# Patient Record
Sex: Female | Born: 1965 | Race: White | Hispanic: No | Marital: Single | Smoking: Former smoker
Health system: Southern US, Community
[De-identification: ages and names within clinical notes are randomized; demographics above are authoritative.]

## PROBLEM LIST (undated history)

## (undated) DIAGNOSIS — S72002A Fracture of unspecified part of neck of left femur, initial encounter for closed fracture: Secondary | ICD-10-CM

## (undated) DIAGNOSIS — E119 Type 2 diabetes mellitus without complications: Secondary | ICD-10-CM

## (undated) DIAGNOSIS — K7460 Unspecified cirrhosis of liver: Secondary | ICD-10-CM

## (undated) DIAGNOSIS — S72009A Fracture of unspecified part of neck of unspecified femur, initial encounter for closed fracture: Secondary | ICD-10-CM

## (undated) DIAGNOSIS — M1990 Unspecified osteoarthritis, unspecified site: Secondary | ICD-10-CM

## (undated) DIAGNOSIS — F32A Depression, unspecified: Secondary | ICD-10-CM

## (undated) DIAGNOSIS — R413 Other amnesia: Secondary | ICD-10-CM

## (undated) DIAGNOSIS — F329 Major depressive disorder, single episode, unspecified: Secondary | ICD-10-CM

## (undated) DIAGNOSIS — F419 Anxiety disorder, unspecified: Secondary | ICD-10-CM

## (undated) DIAGNOSIS — B1920 Unspecified viral hepatitis C without hepatic coma: Secondary | ICD-10-CM

## (undated) DIAGNOSIS — K759 Inflammatory liver disease, unspecified: Secondary | ICD-10-CM

## (undated) DIAGNOSIS — R4020 Unspecified coma: Secondary | ICD-10-CM

## (undated) DIAGNOSIS — Z8489 Family history of other specified conditions: Secondary | ICD-10-CM

## (undated) HISTORY — DX: Unspecified osteoarthritis, unspecified site: M19.90

## (undated) HISTORY — DX: Unspecified viral hepatitis C without hepatic coma: B19.20

## (undated) HISTORY — DX: Anxiety disorder, unspecified: F41.9

## (undated) HISTORY — PX: FRACTURE SURGERY: SHX138

---

## 1995-04-29 DIAGNOSIS — R4020 Unspecified coma: Secondary | ICD-10-CM

## 1995-04-29 HISTORY — PX: OTHER SURGICAL HISTORY: SHX169

## 1995-04-29 HISTORY — DX: Unspecified coma: R40.20

## 2009-09-24 DIAGNOSIS — M5106 Intervertebral disc disorders with myelopathy, lumbar region: Secondary | ICD-10-CM | POA: Insufficient documentation

## 2015-06-26 DIAGNOSIS — K7460 Unspecified cirrhosis of liver: Secondary | ICD-10-CM

## 2015-06-26 DIAGNOSIS — B1920 Unspecified viral hepatitis C without hepatic coma: Secondary | ICD-10-CM

## 2015-06-26 DIAGNOSIS — D696 Thrombocytopenia, unspecified: Secondary | ICD-10-CM

## 2015-06-26 DIAGNOSIS — D72819 Decreased white blood cell count, unspecified: Secondary | ICD-10-CM

## 2016-07-02 DIAGNOSIS — D72819 Decreased white blood cell count, unspecified: Secondary | ICD-10-CM | POA: Diagnosis not present

## 2016-07-02 DIAGNOSIS — K7460 Unspecified cirrhosis of liver: Secondary | ICD-10-CM | POA: Diagnosis not present

## 2016-07-02 DIAGNOSIS — D6959 Other secondary thrombocytopenia: Secondary | ICD-10-CM | POA: Diagnosis not present

## 2016-07-02 DIAGNOSIS — R161 Splenomegaly, not elsewhere classified: Secondary | ICD-10-CM | POA: Diagnosis not present

## 2016-10-26 DIAGNOSIS — E1165 Type 2 diabetes mellitus with hyperglycemia: Secondary | ICD-10-CM | POA: Diagnosis not present

## 2016-10-26 DIAGNOSIS — Z8669 Personal history of other diseases of the nervous system and sense organs: Secondary | ICD-10-CM | POA: Diagnosis not present

## 2016-10-26 DIAGNOSIS — E871 Hypo-osmolality and hyponatremia: Secondary | ICD-10-CM

## 2016-10-26 DIAGNOSIS — Z87820 Personal history of traumatic brain injury: Secondary | ICD-10-CM | POA: Diagnosis not present

## 2016-10-26 DIAGNOSIS — R531 Weakness: Secondary | ICD-10-CM | POA: Diagnosis not present

## 2016-10-27 DIAGNOSIS — E1165 Type 2 diabetes mellitus with hyperglycemia: Secondary | ICD-10-CM | POA: Diagnosis not present

## 2016-10-27 DIAGNOSIS — I6789 Other cerebrovascular disease: Secondary | ICD-10-CM | POA: Diagnosis not present

## 2016-10-27 DIAGNOSIS — R531 Weakness: Secondary | ICD-10-CM | POA: Diagnosis not present

## 2016-10-27 DIAGNOSIS — Z87820 Personal history of traumatic brain injury: Secondary | ICD-10-CM | POA: Diagnosis not present

## 2016-10-27 DIAGNOSIS — Z8669 Personal history of other diseases of the nervous system and sense organs: Secondary | ICD-10-CM | POA: Diagnosis not present

## 2016-12-11 ENCOUNTER — Encounter (HOSPITAL_COMMUNITY): Payer: Self-pay | Admitting: Emergency Medicine

## 2016-12-11 ENCOUNTER — Inpatient Hospital Stay (HOSPITAL_COMMUNITY)
Admission: EM | Admit: 2016-12-11 | Discharge: 2016-12-15 | DRG: 481 | Disposition: A | Discharge status: Skilled Nursing Facility | Payer: Medicaid Other | Attending: Family Medicine | Admitting: Family Medicine

## 2016-12-11 ENCOUNTER — Emergency Department (HOSPITAL_COMMUNITY): Payer: Medicaid Other

## 2016-12-11 DIAGNOSIS — R0602 Shortness of breath: Secondary | ICD-10-CM | POA: Diagnosis not present

## 2016-12-11 DIAGNOSIS — W010XXA Fall on same level from slipping, tripping and stumbling without subsequent striking against object, initial encounter: Secondary | ICD-10-CM | POA: Diagnosis present

## 2016-12-11 DIAGNOSIS — S72142A Displaced intertrochanteric fracture of left femur, initial encounter for closed fracture: Principal | ICD-10-CM | POA: Diagnosis present

## 2016-12-11 DIAGNOSIS — G629 Polyneuropathy, unspecified: Secondary | ICD-10-CM | POA: Diagnosis present

## 2016-12-11 DIAGNOSIS — G43909 Migraine, unspecified, not intractable, without status migrainosus: Secondary | ICD-10-CM | POA: Diagnosis present

## 2016-12-11 DIAGNOSIS — E119 Type 2 diabetes mellitus without complications: Secondary | ICD-10-CM

## 2016-12-11 DIAGNOSIS — E861 Hypovolemia: Secondary | ICD-10-CM | POA: Diagnosis present

## 2016-12-11 DIAGNOSIS — G43709 Chronic migraine without aura, not intractable, without status migrainosus: Secondary | ICD-10-CM | POA: Diagnosis present

## 2016-12-11 DIAGNOSIS — F1021 Alcohol dependence, in remission: Secondary | ICD-10-CM | POA: Diagnosis present

## 2016-12-11 DIAGNOSIS — E1165 Type 2 diabetes mellitus with hyperglycemia: Secondary | ICD-10-CM | POA: Diagnosis present

## 2016-12-11 DIAGNOSIS — F329 Major depressive disorder, single episode, unspecified: Secondary | ICD-10-CM | POA: Diagnosis present

## 2016-12-11 DIAGNOSIS — Z8619 Personal history of other infectious and parasitic diseases: Secondary | ICD-10-CM | POA: Diagnosis not present

## 2016-12-11 DIAGNOSIS — S72002A Fracture of unspecified part of neck of left femur, initial encounter for closed fracture: Secondary | ICD-10-CM | POA: Diagnosis not present

## 2016-12-11 DIAGNOSIS — D62 Acute posthemorrhagic anemia: Secondary | ICD-10-CM | POA: Diagnosis not present

## 2016-12-11 DIAGNOSIS — Z87820 Personal history of traumatic brain injury: Secondary | ICD-10-CM | POA: Diagnosis not present

## 2016-12-11 DIAGNOSIS — Z79899 Other long term (current) drug therapy: Secondary | ICD-10-CM | POA: Diagnosis not present

## 2016-12-11 DIAGNOSIS — B182 Chronic viral hepatitis C: Secondary | ICD-10-CM | POA: Diagnosis present

## 2016-12-11 DIAGNOSIS — K7469 Other cirrhosis of liver: Secondary | ICD-10-CM | POA: Diagnosis present

## 2016-12-11 DIAGNOSIS — D696 Thrombocytopenia, unspecified: Secondary | ICD-10-CM | POA: Diagnosis present

## 2016-12-11 DIAGNOSIS — Z419 Encounter for procedure for purposes other than remedying health state, unspecified: Secondary | ICD-10-CM

## 2016-12-11 DIAGNOSIS — E871 Hypo-osmolality and hyponatremia: Secondary | ICD-10-CM | POA: Diagnosis present

## 2016-12-11 DIAGNOSIS — E8889 Other specified metabolic disorders: Secondary | ICD-10-CM | POA: Diagnosis present

## 2016-12-11 DIAGNOSIS — K7460 Unspecified cirrhosis of liver: Secondary | ICD-10-CM | POA: Diagnosis present

## 2016-12-11 DIAGNOSIS — T148XXA Other injury of unspecified body region, initial encounter: Secondary | ICD-10-CM

## 2016-12-11 DIAGNOSIS — F17210 Nicotine dependence, cigarettes, uncomplicated: Secondary | ICD-10-CM | POA: Diagnosis present

## 2016-12-11 DIAGNOSIS — Z09 Encounter for follow-up examination after completed treatment for conditions other than malignant neoplasm: Secondary | ICD-10-CM

## 2016-12-11 DIAGNOSIS — Z794 Long term (current) use of insulin: Secondary | ICD-10-CM | POA: Diagnosis not present

## 2016-12-11 DIAGNOSIS — M21372 Foot drop, left foot: Secondary | ICD-10-CM | POA: Diagnosis present

## 2016-12-11 DIAGNOSIS — IMO0002 Reserved for concepts with insufficient information to code with codable children: Secondary | ICD-10-CM | POA: Diagnosis present

## 2016-12-11 HISTORY — DX: Fracture of unspecified part of neck of left femur, initial encounter for closed fracture: S72.002A

## 2016-12-11 HISTORY — DX: Major depressive disorder, single episode, unspecified: F32.9

## 2016-12-11 HISTORY — DX: Depression, unspecified: F32.A

## 2016-12-11 HISTORY — DX: Fracture of unspecified part of neck of unspecified femur, initial encounter for closed fracture: S72.009A

## 2016-12-11 HISTORY — DX: Type 2 diabetes mellitus without complications: E11.9

## 2016-12-11 HISTORY — DX: Unspecified cirrhosis of liver: K74.60

## 2016-12-11 HISTORY — DX: Inflammatory liver disease, unspecified: K75.9

## 2016-12-11 HISTORY — DX: Family history of other specified conditions: Z84.89

## 2016-12-11 HISTORY — DX: Unspecified coma: R40.20

## 2016-12-11 HISTORY — DX: Other amnesia: R41.3

## 2016-12-11 LAB — CBC WITH DIFFERENTIAL/PLATELET
BASOS PCT: 0 %
Basophils Absolute: 0 10*3/uL (ref 0.0–0.1)
EOS ABS: 0.1 10*3/uL (ref 0.0–0.7)
Eosinophils Relative: 1 %
HEMATOCRIT: 36.8 % (ref 36.0–46.0)
Hemoglobin: 13.5 g/dL (ref 12.0–15.0)
Lymphocytes Relative: 20 %
Lymphs Abs: 0.8 10*3/uL (ref 0.7–4.0)
MCH: 34 pg (ref 26.0–34.0)
MCHC: 36.7 g/dL — AB (ref 30.0–36.0)
MCV: 92.7 fL (ref 78.0–100.0)
MONO ABS: 0.2 10*3/uL (ref 0.1–1.0)
MONOS PCT: 6 %
NEUTROS ABS: 3.1 10*3/uL (ref 1.7–7.7)
Neutrophils Relative %: 73 %
Platelets: 51 10*3/uL — ABNORMAL LOW (ref 150–400)
RBC: 3.97 MIL/uL (ref 3.87–5.11)
RDW: 13.9 % (ref 11.5–15.5)
WBC: 4.2 10*3/uL (ref 4.0–10.5)

## 2016-12-11 LAB — BASIC METABOLIC PANEL
Anion gap: 8 (ref 5–15)
BUN: 6 mg/dL (ref 6–20)
CALCIUM: 8.9 mg/dL (ref 8.9–10.3)
CO2: 22 mmol/L (ref 22–32)
CREATININE: 0.51 mg/dL (ref 0.44–1.00)
Chloride: 98 mmol/L — ABNORMAL LOW (ref 101–111)
GFR calc non Af Amer: >60 mL/min (ref >60)
Glucose, Bld: 405 mg/dL — ABNORMAL HIGH (ref 65–99)
Potassium: 3.7 mmol/L (ref 3.5–5.1)
Sodium: 128 mmol/L — ABNORMAL LOW (ref 135–145)

## 2016-12-11 LAB — TYPE AND SCREEN
ABO/RH(D): O POS
Antibody Screen: NEGATIVE

## 2016-12-11 MED ORDER — INSULIN DETEMIR 100 UNIT/ML ~~LOC~~ SOLN
15.0000 [IU] | Freq: Every day | SUBCUTANEOUS | Status: DC
  Filled 2016-12-11 (×2): qty 0.15

## 2016-12-11 MED ORDER — SENNOSIDES-DOCUSATE SODIUM 8.6-50 MG PO TABS: 1.0000 | ORAL_TABLET | Freq: Every evening | ORAL | Status: DC | PRN

## 2016-12-11 MED ORDER — MORPHINE SULFATE (PF) 4 MG/ML IV SOLN
4.0000 mg | Freq: Once | INTRAVENOUS | Status: AC
  Administered 2016-12-11: 4 mg via INTRAVENOUS
  Filled 2016-12-11: qty 1

## 2016-12-11 MED ORDER — VITAMIN C 500 MG PO TABS
1000.0000 mg | ORAL_TABLET | Freq: Every day | ORAL | Status: DC
  Administered 2016-12-14 – 2016-12-15 (×2): 1000 mg via ORAL
  Filled 2016-12-11 (×3): qty 2

## 2016-12-11 MED ORDER — SODIUM CHLORIDE 0.9 % IV SOLN
INTRAVENOUS | Status: AC
  Administered 2016-12-12: via INTRAVENOUS

## 2016-12-11 MED ORDER — ONDANSETRON HCL 4 MG/2ML IJ SOLN: 4.0000 mg | Freq: Four times a day (QID) | INTRAMUSCULAR | Status: DC | PRN

## 2016-12-11 MED ORDER — GABAPENTIN 100 MG PO CAPS
100.0000 mg | ORAL_CAPSULE | Freq: Every day | ORAL | Status: DC
  Administered 2016-12-14 – 2016-12-15 (×2): 100 mg via ORAL
  Filled 2016-12-11 (×2): qty 1

## 2016-12-11 MED ORDER — INSULIN ASPART 100 UNIT/ML ~~LOC~~ SOLN
0.0000 [IU] | SUBCUTANEOUS | Status: DC
  Administered 2016-12-12: 2 [IU] via SUBCUTANEOUS
  Administered 2016-12-12 (×2): 8 [IU] via SUBCUTANEOUS
  Administered 2016-12-12: 2 [IU] via SUBCUTANEOUS
  Administered 2016-12-13 (×3): 3 [IU] via SUBCUTANEOUS
  Administered 2016-12-13: 8 [IU] via SUBCUTANEOUS
  Administered 2016-12-14 (×2): 5 [IU] via SUBCUTANEOUS
  Administered 2016-12-14: 11 [IU] via SUBCUTANEOUS
  Administered 2016-12-14 (×3): 5 [IU] via SUBCUTANEOUS
  Administered 2016-12-15: 8 [IU] via SUBCUTANEOUS
  Administered 2016-12-15: 5 [IU] via SUBCUTANEOUS
  Administered 2016-12-15: 3 [IU] via SUBCUTANEOUS

## 2016-12-11 MED ORDER — BISACODYL 5 MG PO TBEC
5.0000 mg | DELAYED_RELEASE_TABLET | Freq: Every day | ORAL | Status: DC | PRN
  Administered 2016-12-15: 5 mg via ORAL
  Filled 2016-12-11: qty 1

## 2016-12-11 MED ORDER — AMITRIPTYLINE HCL 50 MG PO TABS
100.0000 mg | ORAL_TABLET | Freq: Every day | ORAL | Status: DC
  Administered 2016-12-12 – 2016-12-14 (×4): 100 mg via ORAL
  Filled 2016-12-11 (×2): qty 2
  Filled 2016-12-11: qty 4

## 2016-12-11 MED ORDER — MORPHINE SULFATE (PF) 4 MG/ML IV SOLN
0.5000 mg | INTRAVENOUS | Status: DC | PRN
  Administered 2016-12-12 – 2016-12-14 (×10): 0.52 mg via INTRAVENOUS
  Filled 2016-12-11 (×12): qty 1

## 2016-12-11 MED ORDER — HYDROCODONE-ACETAMINOPHEN 5-325 MG PO TABS
1.0000 | ORAL_TABLET | Freq: Four times a day (QID) | ORAL | Status: DC | PRN
  Administered 2016-12-12 – 2016-12-15 (×8): 2 via ORAL
  Filled 2016-12-11 (×10): qty 2

## 2016-12-11 MED ORDER — SODIUM CHLORIDE 0.9 % IV SOLN
INTRAVENOUS | Status: DC
  Administered 2016-12-11: 20 mL/h via INTRAVENOUS

## 2016-12-11 MED ORDER — SODIUM CHLORIDE 0.9 % IV BOLUS (SEPSIS): 500.0000 mL | Freq: Once | INTRAVENOUS | Status: DC

## 2016-12-11 NOTE — ED Notes (Signed)
Patient transported to X-ray 
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
## 2016-12-11 NOTE — ED Provider Notes (Signed)
[facility] DEPT Provider Note   CSN: [id number] Arrival date & time: 12/11/16  1932     History   Chief Complaint No chief complaint on file.   HPI Alexis Floyd is a 51 y.o. female.  51 year old female here complaining of left hip pain that started after she had a mechanical fall just prior to arrival. States that she lost her balance. Denies any head or neck trauma. No chest or abdominal discomfort. Has dull pain at her left lateral hip that is worse with any movement. Denies any back pain. No saddle anesthesias. No numbness to her left foot. EMS called and patient treated with fentanyl as well as immobilization and transported here. She does have a prior history of hip surgery.      No past medical history on file.  There are no active problems to display for this patient.   No past surgical history on file.  OB History    No data available       Home Medications    Prior to Admission medications   Not on File    Family History No family history on file.  Social History Social History  Substance Use Topics  . Smoking status: Not on file  . Smokeless tobacco: Not on file  . Alcohol use Not on file     Allergies   Patient has no allergy information on record.   Review of Systems Review of Systems  All other systems reviewed and are negative.    Physical Exam Updated Vital Signs There were no vitals taken for this visit.  Physical Exam  Constitutional: She is oriented to person, place, and time. She appears well-developed and well-nourished.  Non-toxic appearance. No distress.  HENT:  Head: Normocephalic and atraumatic.  Eyes: Pupils are equal, round, and reactive to light. Conjunctivae, EOM and lids are normal.  Neck: Normal range of motion. Neck supple. No tracheal deviation present. No thyroid mass present.  Cardiovascular: Normal rate, regular rhythm and normal heart sounds.  Exam reveals no gallop.   No murmur  heard. Pulmonary/Chest: Effort normal and breath sounds normal. No stridor. No respiratory distress. She has no decreased breath sounds. She has no wheezes. She has no rhonchi. She has no rales.  Abdominal: Soft. Normal appearance and bowel sounds are normal. She exhibits no distension. There is no tenderness. There is no rebound and no CVA tenderness.  Musculoskeletal: She exhibits no edema.       Left hip: She exhibits decreased range of motion, tenderness and bony tenderness.       Legs: Left hip is shortened and slightly internally rotated. Patellar reflex intact. Due to palpation at the left greater trochanter. Paraspinal muscle tenderness of the lower lumbar spine.  Neurological: She is alert and oriented to person, place, and time. She has normal strength. No cranial nerve deficit or sensory deficit. GCS eye subscore is 4. GCS verbal subscore is 5. GCS motor subscore is 6.  Skin: Skin is warm and dry. No abrasion and no rash noted.  Psychiatric: She has a normal mood and affect. Her speech is normal and behavior is normal.  Nursing note and vitals reviewed.    ED Treatments / Results  Labs (all labs ordered are listed, but only abnormal results are displayed) Labs Reviewed - No data to display  EKG  EKG Interpretation None       Radiology No results found.  Procedures Procedures (including critical care time)  Medications Ordered in ED Medications -  No data to display   Initial Impression / Assessment and Plan / ED Course  I have reviewed the triage vital signs and the nursing notes.  Pertinent labs & imaging results that were available during my care of the patient were reviewed by me and considered in my medical decision making (see chart for details).     Patient medicated for pain here. X-ray consistent with left hip fracture. Discussed with orthopedics on-call will see her tomorrow morning. Patient to be admitted to the hospitalist service  Final Clinical  Impressions(s) / ED Diagnoses   Final diagnoses:  None    New Prescriptions New Prescriptions   No medications on file     Lorre Nick, MD 12/11/16 2200

## 2016-12-11 NOTE — H&P (Signed)
History and Physical    Alexis Floyd ZOX:[id number] DOB: 1966-04-26 DOA: 12/11/2016  PCP: No primary care provider on file.   Patient coming from: Home  Chief Complaint: Fall with severe left hip pain  HPI: Alexis Floyd is a 51 y.o. female with medical history significant for alcoholism in remission, insulin-dependent diabetes mellitus, chronic migraines, liver cirrhosis, and chronic thrombocytopenia, now presenting to the emergency department with severe left hip pain after a mechanical fall. Patient reports that she was in her usual state of health when she tripped over something and fell onto her left side, experiencing severe pain at the left hip. She denies striking her head or losing consciousness. EMS was called out to the scene and the patient was treated with 250 g of fentanyl and 500 mL of normal saline. She reports being in her usual state to the fall. No chest pain or palpitations. No recent fevers or chills.   ED Course: Upon arrival to the ED, patient is found to be afebrile, saturating well on room air, and with vitals otherwise stable. Chest x-ray is negative for acute cardiopulmonary disease. Chemistry panels notable for a serum glucose of 495 and sodium of 128. CBC features a chronic thrombocytopenia with platelets 51,000. EKG and urinalysis remained pending at this time. Radiographs of the hips and pelvis demonstrated impacted angulated probable subtrochanteric fracture of the left hip. Orthopedic surgery was consulted by the ED physician, request for medical admission, and indicated that they will tentatively plan to operate in the morning. Patient remained hemodynamically stable and in no apparent respiratory distress, and she will be admitted to the medical/surgical unit for ongoing evaluation and management of left hip fracture.  Review of Systems:  All other systems reviewed and apart from HPI, are negative.  Past Medical History:  Diagnosis Date  . Closed left hip  fracture, initial encounter (HCC) 12/11/2016  . Diabetes mellitus without complication (HCC)    type II  . Hip fx (HCC)    Bilateral    Past Surgical History:  Procedure Laterality Date  . FRACTURE SURGERY       reports that she has been smoking Cigarettes.  She has been smoking about 1.00 pack per day. She has never used smokeless tobacco. She reports that she uses drugs, including Marijuana. She reports that she does not drink alcohol.  No Known Allergies  History reviewed. No pertinent family history.   Prior to Admission medications   Medication Sig Start Date End Date Taking? Authorizing Provider  amitriptyline (ELAVIL) 100 MG tablet Take 100 mg by mouth. 08/26/16 08/26/17 Yes [provider]  Ascorbic Acid (VITAMIN C) 1000 MG tablet Take 1,000 mg by mouth.   Yes [provider]  gabapentin (NEURONTIN) 100 MG capsule Take 100 mg by mouth daily.   Yes [provider]  Insulin Detemir (LEVEMIR FLEXTOUCH) 100 UNIT/ML Pen Inject 20 Units into the skin at bedtime.   Yes [provider]    Physical Exam: Vitals:   12/11/16 1944 12/11/16 1945 12/11/16 1948 12/11/16 2045  BP: 127/90 (!) 138/96  (!) 141/74  Pulse: 96 99  (!) 101  Resp: 16 16  16   Temp: 98.3 F (36.8 C)     TempSrc: Oral     SpO2: 97% 96%  99%  Weight:   59.4 kg (131 lb)   Height:   5\' 6"  (1.676 m)       Constitutional: NAD, calm, in apparent discomfort.  Eyes: PERTLA, lids and conjunctivae normal ENMT:  Mucous membranes are dry. Posterior pharynx clear of any exudate or lesions.   Neck: normal, supple, no masses, no thyromegaly Respiratory: clear to auscultation bilaterally, no wheezing, no crackles. Normal respiratory effort.  Cardiovascular: S1 & S2 heard, regular rate and rhythm. No extremity edema.   Abdomen: No distension, no tenderness, no masses palpated. Bowel sounds active.  Musculoskeletal: no clubbing / cyanosis. Left hip exquisitely tender, with 2+ pedal pulse  and intact sensation and motor function in left foot.  Skin: no significant rashes, lesions, ulcers. Warm, dry, well-perfused. Neurologic: Dysarthria (chronic per neurology office notes). Sensation intact. Strength 5/5 in all 4 limbs.  Psychiatric: Alert and oriented x 3. Calm, cooperative.    Labs on Admission: I have personally reviewed following labs and imaging studies  CBC:  Recent Labs Lab 12/11/16 2102  WBC 4.2  NEUTROABS 3.1  HGB 13.5  HCT 36.8  MCV 92.7  PLT 51*   Basic Metabolic Panel:  Recent Labs Lab 12/11/16 2102  NA 128*  K 3.7  CL 98*  CO2 22  GLUCOSE 405*  BUN 6  CREATININE 0.51  CALCIUM 8.9   GFR: Estimated Creatinine Clearance: 79.6 mL/min (by C-G formula based on SCr of 0.51 mg/dL). Liver Function Tests: No results for input(s): AST, ALT, ALKPHOS, BILITOT, PROT, ALBUMIN in the last 168 hours. No results for input(s): LIPASE, AMYLASE in the last 168 hours. No results for input(s): AMMONIA in the last 168 hours. Coagulation Profile: No results for input(s): INR, PROTIME in the last 168 hours. Cardiac Enzymes: No results for input(s): CKTOTAL, CKMB, CKMBINDEX, TROPONINI in the last 168 hours. BNP (last 3 results) No results for input(s): PROBNP in the last 8760 hours. HbA1C: No results for input(s): HGBA1C in the last 72 hours. CBG: No results for input(s): GLUCAP in the last 168 hours. Lipid Profile: No results for input(s): CHOL, HDL, LDLCALC, TRIG, CHOLHDL, LDLDIRECT in the last 72 hours. Thyroid Function Tests: No results for input(s): TSH, T4TOTAL, FREET4, T3FREE, THYROIDAB in the last 72 hours. Anemia Panel: No results for input(s): VITAMINB12, FOLATE, FERRITIN, TIBC, IRON, RETICCTPCT in the last 72 hours. Urine analysis: No results found for: COLORURINE, APPEARANCEUR, LABSPEC, PHURINE, GLUCOSEU, HGBUR, BILIRUBINUR, KETONESUR, PROTEINUR, UROBILINOGEN, NITRITE, LEUKOCYTESUR Sepsis Labs: @LABRCNTIP (procalcitonin:4,lacticidven:4) )No  results found for this or any previous visit (from the past 240 hour(s)).   Radiological Exams on Admission: Dg Chest 1 View  Result Date: 12/11/2016 CLINICAL DATA:  Preoperative exam EXAM: CHEST 1 VIEW COMPARISON:  None. FINDINGS: The heart size and mediastinal contours are within normal limits. Both lungs are clear. The visualized skeletal structures are unremarkable. IMPRESSION: No active disease. Electronically Signed   By: Deatra RobinsonKevin  Herman M.D.   On: 12/11/2016 22:01   Dg Hip Unilat W Or Wo Pelvis 2-3 Views Left  Result Date: 12/11/2016 CLINICAL DATA:  51 year old female with history of trauma from a fall complaining of pain in the left hip and left leg. EXAM: DG HIP (WITH OR WITHOUT PELVIS) 2-3V LEFT COMPARISON:  No priors. FINDINGS: Two nonstandard views are submitted for evaluation, which demonstrates what appears to be an impacted subtrochanteric fracture of the left hip with some varus angulation. There is also approximately 9 mm of medial displacement. Left femoral head appears to be located on this limited examination. Visualized portions of the bony pelvis and right proximal femur also appear intact. IMPRESSION: 1. Impacted angulated probable subtrochanteric fracture of the left hip, as discussed above. The possibility of an intertrochanteric component of the fracture is not  excluded. Electronically Signed   By: Trudie Reed M.D.   On: 12/11/2016 20:41    EKG: Independently reviewed. Normal sinus rhythm.   Assessment/Plan  1. Left hip fracture  - Pt presents with severe left hip pain after a ground-level mechanical fall, and radiographs demonstrate likely subtrochanteric fracture  - Orthopedic surgery is consulting and much appreciated, planning for operative repair on 12/12/16  - Based on the available data, Alexis Floyd presents an estimated 0.4% risk probability for perioperative MI or cardiac arrest per Nolon Nations al - Platelets chronically low, 51k on admission, will ask blood  bank to prepare platelets for transfusion in am if platelets 50k or less  - Continue IVF hydration, NPO, prn analgesia    2. Insulin-dependent DM with hyperglycemia  - Serum glucose is 405 on admission; no A1c on file  - Managed at home with Levemir 20 units qHS  - Check CBG q4h while NPO  - Start with 15 units Levemir and moderate-intensity SSI with Novolog   3. Liver cirrhosis  - Previously followed by GI at [facility]  - Attributed to HCV and prior alcohol abuse  - No ascites on exam  - Appears well-compensated   4. Hyponatremia  - Serum sodium is 128 on admission  - Likely secondary to marked hyperglycemia, cirrhosis, and hypovolemia  - Plan to treat with insulin and normal saline, repeat chemistries in am    5. Chronic migraine  - Stable  - Continue Elavil   6. Thrombocytopenia  - Platelets 51,000 on admission  - Chronically low and likely secondary to liver disease  - There is no bleeding evident  - Repeat CBC in am and transfuse platelets if 50k or less     DVT prophylaxis: SCD's  Code Status: Full  Family Communication: Discussed with patient Disposition Plan: Admit to med-surg Consults called: Orthopedic surgery Admission status: Inpatient    Briscoe Deutscher, MD [facility] Pager [phone]  If 7PM-7AM, please contact night-coverage [email or web address] Password [email or web address]  12/11/2016, 10:13 PM

## 2016-12-12 ENCOUNTER — Encounter (HOSPITAL_COMMUNITY): Payer: Self-pay | Admitting: General Practice

## 2016-12-12 ENCOUNTER — Inpatient Hospital Stay (HOSPITAL_COMMUNITY): Payer: Medicaid Other

## 2016-12-12 DIAGNOSIS — S72002A Fracture of unspecified part of neck of left femur, initial encounter for closed fracture: Secondary | ICD-10-CM

## 2016-12-12 LAB — GLUCOSE, CAPILLARY
GLUCOSE-CAPILLARY: 280 mg/dL — AB (ref 65–99)
GLUCOSE-CAPILLARY: 263 mg/dL — AB (ref 65–99)
GLUCOSE-CAPILLARY: 194 mg/dL — AB (ref 65–99)
GLUCOSE-CAPILLARY: 139 mg/dL — AB (ref 65–99)
Glucose-Capillary: 304 mg/dL — ABNORMAL HIGH (ref 65–99)

## 2016-12-12 LAB — BASIC METABOLIC PANEL
Anion gap: 10 (ref 5–15)
BUN: 5 mg/dL — AB (ref 6–20)
CO2: 23 mmol/L (ref 22–32)
CREATININE: 0.43 mg/dL — AB (ref 0.44–1.00)
Calcium: 8.9 mg/dL (ref 8.9–10.3)
Chloride: 101 mmol/L (ref 101–111)
GFR calc Af Amer: >60 mL/min (ref >60)
Glucose, Bld: 314 mg/dL — ABNORMAL HIGH (ref 65–99)
Potassium: 3.7 mmol/L (ref 3.5–5.1)
SODIUM: 134 mmol/L — AB (ref 135–145)

## 2016-12-12 LAB — URINALYSIS, ROUTINE W REFLEX MICROSCOPIC
BILIRUBIN URINE: NEGATIVE
HGB URINE DIPSTICK: NEGATIVE
KETONES UR: 20 mg/dL — AB
Leukocytes, UA: NEGATIVE
NITRITE: NEGATIVE
PROTEIN: NEGATIVE mg/dL
Specific Gravity, Urine: 1.022 (ref 1.005–1.030)
pH: 5 (ref 5.0–8.0)

## 2016-12-12 LAB — CBC
HCT: 36.8 % (ref 36.0–46.0)
Hemoglobin: 13.4 g/dL (ref 12.0–15.0)
MCH: 33.2 pg (ref 26.0–34.0)
MCHC: 36.4 g/dL — AB (ref 30.0–36.0)
MCV: 91.1 fL (ref 78.0–100.0)
PLATELETS: 61 10*3/uL — AB (ref 150–400)
RBC: 4.04 MIL/uL (ref 3.87–5.11)
RDW: 13.7 % (ref 11.5–15.5)
WBC: 5.6 10*3/uL (ref 4.0–10.5)

## 2016-12-12 LAB — HEPATIC FUNCTION PANEL
ALBUMIN: 3.3 g/dL — AB (ref 3.5–5.0)
ALT: 36 U/L (ref 14–54)
AST: 41 U/L (ref 15–41)
Alkaline Phosphatase: 171 U/L — ABNORMAL HIGH (ref 38–126)
BILIRUBIN TOTAL: 1.7 mg/dL — AB (ref 0.3–1.2)
Bilirubin, Direct: 0.4 mg/dL (ref 0.1–0.5)
Indirect Bilirubin: 1.3 mg/dL — ABNORMAL HIGH (ref 0.3–0.9)
TOTAL PROTEIN: 7.3 g/dL (ref 6.5–8.1)

## 2016-12-12 LAB — HIV ANTIBODY (ROUTINE TESTING W REFLEX): HIV SCREEN 4TH GENERATION: NONREACTIVE

## 2016-12-12 LAB — RAPID URINE DRUG SCREEN, HOSP PERFORMED
Amphetamines: NOT DETECTED
Barbiturates: NOT DETECTED
Benzodiazepines: NOT DETECTED
Cocaine: NOT DETECTED
OPIATES: POSITIVE — AB
TETRAHYDROCANNABINOL: POSITIVE — AB

## 2016-12-12 LAB — HEMOGLOBIN A1C
HEMOGLOBIN A1C: 12.7 % — AB (ref 4.8–5.6)
MEAN PLASMA GLUCOSE: 317.79 mg/dL

## 2016-12-12 LAB — PROTIME-INR
INR: 1.06
Prothrombin Time: 13.8 seconds (ref 11.4–15.2)

## 2016-12-12 LAB — ABO/RH: ABO/RH(D): O POS

## 2016-12-12 LAB — APTT: aPTT: 28 seconds (ref 24–36)

## 2016-12-12 MED ORDER — INSULIN DETEMIR 100 UNIT/ML ~~LOC~~ SOLN
20.0000 [IU] | Freq: Every day | SUBCUTANEOUS | Status: DC
  Administered 2016-12-12 – 2016-12-14 (×3): 20 [IU] via SUBCUTANEOUS
  Filled 2016-12-12 (×4): qty 0.2

## 2016-12-12 MED ORDER — GLUCERNA SHAKE PO LIQD
237.0000 mL | Freq: Two times a day (BID) | ORAL | Status: DC
  Administered 2016-12-13 – 2016-12-15 (×5): 237 mL via ORAL
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
## 2016-12-12 NOTE — Progress Notes (Addendum)
PROGRESS NOTE    Alexis Floyd  JQZ:[id number] DOB: April 17, 1966 DOA: 12/11/2016 PCP: Abner Greenspan, MD  Brief Narrative:  51 y.o. female with medical history significant for alcoholism in remission, insulin-dependent diabetes mellitus, chronic migraines, liver cirrhosis, and chronic thrombocytopenia, now presenting to the emergency department with severe left hip pain after a mechanical fall. Patient reports that she was in her usual state of health when she tripped over something and fell onto her left side, experiencing severe pain at the left hip.   Assessment & Plan:   Principal Problem:   Closed left hip fracture, initial encounter [facility]) - Orthopedic surgery on board and managing. - Continue supportive therapy  Active Problems:   Other cirrhosis of liver (HCC) - Stable AST and ALT were within normal limits    Thrombocytopenia (HCC) - Most likely secondary to liver disease    Hyponatremia - Mild and improved from last check    Diabetes mellitus, type II, insulin dependent (HCC) -Once able to advance diet will plan on advancing the diabetic diet. Continue current insulin regimen. But will increase lantus dose by 5 units    Chronic migraine - stable no HA reported.  DVT prophylaxis: Per orthopaedic surgery Code Status: Full Family Communication: none at bedside Disposition Plan: Pending improvement in condition based on surgery and physical therapy evaluation   Consultants:   ortho   Procedures: per ortho   Antimicrobials: None   Subjective: Patient has no new complaints reported to me today. Has been having pain but controlled with pain medication  Objective: Vitals:   12/11/16 2300 12/11/16 2315 12/12/16 0044 12/12/16 0434  BP: 125/89 112/88 138/62 135/82  Pulse: 100 99 (!) 102 100  Resp: (!) 25 17 19 17   Temp:   97.6 F (36.4 C) 97.8 F (36.6 C)  TempSrc:   Oral Oral  SpO2: 95% 93% 95% 93%  Weight:      Height:        Intake/Output Summary  (Last 24 hours) at 12/12/16 1713 Last data filed at 12/12/16 0928  Gross per 24 hour  Intake              240 ml  Output             1941 ml  Net            -1701 ml   Filed Weights   12/11/16 1948  Weight: 59.4 kg (131 lb)    Examination:  General exam: Appears calm and comfortable, in nad. Respiratory system: Clear to auscultation. Respiratory effort normal. Cardiovascular system: S1 & S2 heard, RRR. No JVD, murmurs, rubs, gallops  Gastrointestinal system: Abdomen is nondistended, soft and nontender. No organomegaly or masses felt. Normal bowel sounds heard. Central nervous system: Alert and oriented. No focal neurological deficits. Extremities: Symmetric 5 x 5 power. Skin: No rashes, lesions or ulcers, on limited exam. Psychiatry:  Mood & affect appropriate  Data Reviewed: I have personally reviewed following labs and imaging studies  CBC:  Recent Labs Lab 12/11/16 2102 12/12/16 0036  WBC 4.2 5.6  NEUTROABS 3.1  --   HGB 13.5 13.4  HCT 36.8 36.8  MCV 92.7 91.1  PLT 51* 61*   Basic Metabolic Panel:  Recent Labs Lab 12/11/16 2102 12/12/16 0036  NA 128* 134*  K 3.7 3.7  CL 98* 101  CO2 22 23  GLUCOSE 405* 314*  BUN 6 5*  CREATININE 0.51 0.43*  CALCIUM 8.9 8.9   GFR: Estimated Creatinine Clearance: 79.6  mL/min (A) (by C-G formula based on SCr of 0.43 mg/dL (L)). Liver Function Tests:  Recent Labs Lab 12/12/16 0036  AST 41  ALT 36  ALKPHOS 171*  BILITOT 1.7*  PROT 7.3  ALBUMIN 3.3*   No results for input(s): LIPASE, AMYLASE in the last 168 hours. No results for input(s): AMMONIA in the last 168 hours. Coagulation Profile:  Recent Labs Lab 12/12/16 0036  INR 1.06   Cardiac Enzymes: No results for input(s): CKTOTAL, CKMB, CKMBINDEX, TROPONINI in the last 168 hours. BNP (last 3 results) No results for input(s): PROBNP in the last 8760 hours. HbA1C:  Recent Labs  12/12/16 0036  HGBA1C 12.7*   CBG:  Recent Labs Lab 12/12/16 0006  12/12/16 0430 12/12/16 1011 12/12/16 1206 12/12/16 1647  GLUCAP 304* 280* 263* 194* 139*   Lipid Profile: No results for input(s): CHOL, HDL, LDLCALC, TRIG, CHOLHDL, LDLDIRECT in the last 72 hours. Thyroid Function Tests: No results for input(s): TSH, T4TOTAL, FREET4, T3FREE, THYROIDAB in the last 72 hours. Anemia Panel: No results for input(s): VITAMINB12, FOLATE, FERRITIN, TIBC, IRON, RETICCTPCT in the last 72 hours. Sepsis Labs: No results for input(s): PROCALCITON, LATICACIDVEN in the last 168 hours.  No results found for this or any previous visit (from the past 240 hour(s)).   Radiology Studies: Dg Chest 1 View  Result Date: 12/11/2016 CLINICAL DATA:  Preoperative exam EXAM: CHEST 1 VIEW COMPARISON:  None. FINDINGS: The heart size and mediastinal contours are within normal limits. Both lungs are clear. The visualized skeletal structures are unremarkable. IMPRESSION: No active disease. Electronically Signed   By: Deatra Robinson M.D.   On: 12/11/2016 22:01   Dg Hip Unilat With Pelvis 1v Left  Result Date: 12/12/2016 CLINICAL DATA:  Left femoral fracture EXAM: DG HIP (WITH OR WITHOUT PELVIS) 1V*L* COMPARISON:  None. FINDINGS: Comminuted left intertrochanteric fracture with mild angulation and displacement. No hip dislocation. No aggressive osseous lesion. IMPRESSION: Comminuted left intertrochanteric fracture with mild angulation and displacement. Electronically Signed   By: Elige Ko   On: 12/12/2016 15:04   Dg Hip Unilat W Or Wo Pelvis 2-3 Views Left  Result Date: 12/11/2016 CLINICAL DATA:  51 year old female with history of trauma from a fall complaining of pain in the left hip and left leg. EXAM: DG HIP (WITH OR WITHOUT PELVIS) 2-3V LEFT COMPARISON:  No priors. FINDINGS: Two nonstandard views are submitted for evaluation, which demonstrates what appears to be an impacted subtrochanteric fracture of the left hip with some varus angulation. There is also approximately 9 mm of  medial displacement. Left femoral head appears to be located on this limited examination. Visualized portions of the bony pelvis and right proximal femur also appear intact. IMPRESSION: 1. Impacted angulated probable subtrochanteric fracture of the left hip, as discussed above. The possibility of an intertrochanteric component of the fracture is not excluded. Electronically Signed   By: Trudie Reed M.D.   On: 12/11/2016 20:41   Scheduled Meds: . amitriptyline  100 mg Oral QHS  . [START ON 12/13/2016] feeding supplement (GLUCERNA SHAKE)  237 mL Oral BID BM  . gabapentin  100 mg Oral Daily  . insulin aspart  0-15 Units Subcutaneous Q4H  . insulin detemir  15 Units Subcutaneous QHS  . vitamin C  1,000 mg Oral Daily   Continuous Infusions: . sodium chloride       LOS: 1 day   Time spent:> 35  Penny Pia, MD [facility] Pager [phone]  If 7PM-7AM, please contact  night-coverage [email or web address] Password [email or web address] 12/12/2016, 5:13 PM

## 2016-12-12 NOTE — Progress Notes (Signed)
Orthopedic Tech Progress Note Patient Details:  Alexis Floyd 1965/06/13 [id number]  Musculoskeletal Traction Type of Traction: [place]ucks Skin Traction Traction Location: lle Traction Weight: 10 lbs    Trinna Post 12/12/2016, 8:43 PM
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
## 2016-12-12 NOTE — Progress Notes (Signed)
[facility]opaedic Trauma [facility]  Pt will go to OR tomorrow with Dr. Linna Caprice for IMN  L hip Appreciate his assistance with expediting pt care   Mearl Latin, PA-C [facility] [phone] (C) [phone] (O) 12/12/2016 10:12 PM

## 2016-12-13 ENCOUNTER — Inpatient Hospital Stay (HOSPITAL_COMMUNITY): Payer: Medicaid Other

## 2016-12-13 ENCOUNTER — Encounter (HOSPITAL_COMMUNITY)
Admission: EM | Disposition: A | Discharge status: Skilled Nursing Facility | Payer: Self-pay | Source: Home / Self Care | Attending: Family Medicine

## 2016-12-13 ENCOUNTER — Encounter (HOSPITAL_COMMUNITY): Payer: Self-pay | Admitting: Certified Registered Nurse Anesthetist

## 2016-12-13 ENCOUNTER — Inpatient Hospital Stay (HOSPITAL_COMMUNITY): Payer: Medicaid Other | Admitting: Certified Registered Nurse Anesthetist

## 2016-12-13 HISTORY — PX: INTRAMEDULLARY (IM) NAIL INTERTROCHANTERIC: SHX5875

## 2016-12-13 LAB — GLUCOSE, CAPILLARY
GLUCOSE-CAPILLARY: 132 mg/dL — AB (ref 65–99)
GLUCOSE-CAPILLARY: 165 mg/dL — AB (ref 65–99)
GLUCOSE-CAPILLARY: 180 mg/dL — AB (ref 65–99)
GLUCOSE-CAPILLARY: 198 mg/dL — AB (ref 65–99)
GLUCOSE-CAPILLARY: 204 mg/dL — AB (ref 65–99)
Glucose-Capillary: 133 mg/dL — ABNORMAL HIGH (ref 65–99)
Glucose-Capillary: 260 mg/dL — ABNORMAL HIGH (ref 65–99)

## 2016-12-13 LAB — VITAMIN D 25 HYDROXY (VIT D DEFICIENCY, FRACTURES): Vit D, 25-Hydroxy: 12.4 ng/mL — ABNORMAL LOW (ref 30.0–100.0)

## 2016-12-13 LAB — SURGICAL PCR SCREEN
MRSA, PCR: NEGATIVE
Staphylococcus aureus: POSITIVE — AB

## 2016-12-13 SURGERY — FIXATION, FRACTURE, INTERTROCHANTERIC, WITH INTRAMEDULLARY ROD
Anesthesia: General | Site: Leg Upper | Laterality: Left

## 2016-12-13 MED ORDER — SUGAMMADEX SODIUM 200 MG/2ML IV SOLN
INTRAVENOUS | Status: DC | PRN
  Administered 2016-12-13: 118.8 mg via INTRAVENOUS

## 2016-12-13 MED ORDER — MENTHOL 3 MG MT LOZG: 1.0000 | LOZENGE | OROMUCOSAL | Status: DC | PRN

## 2016-12-13 MED ORDER — MIDAZOLAM HCL 5 MG/5ML IJ SOLN
INTRAMUSCULAR | Status: DC | PRN
  Administered 2016-12-13: 2 mg via INTRAVENOUS

## 2016-12-13 MED ORDER — METOCLOPRAMIDE HCL 5 MG PO TABS: 5.0000 mg | ORAL_TABLET | Freq: Three times a day (TID) | ORAL | Status: DC | PRN

## 2016-12-13 MED ORDER — MEPERIDINE HCL 25 MG/ML IJ SOLN: 6.2500 mg | INTRAMUSCULAR | Status: DC | PRN

## 2016-12-13 MED ORDER — ROCURONIUM BROMIDE 100 MG/10ML IV SOLN
INTRAVENOUS | Status: DC | PRN
  Administered 2016-12-13: 40 mg via INTRAVENOUS

## 2016-12-13 MED ORDER — PROMETHAZINE HCL 25 MG/ML IJ SOLN: 6.2500 mg | INTRAMUSCULAR | Status: DC | PRN

## 2016-12-13 MED ORDER — PHENOL 1.4 % MT LIQD: 1.0000 | OROMUCOSAL | Status: DC | PRN

## 2016-12-13 MED ORDER — 0.9 % SODIUM CHLORIDE (POUR BTL) OPTIME
TOPICAL | Status: DC | PRN
  Administered 2016-12-13: 1000 mL

## 2016-12-13 MED ORDER — MUPIROCIN 2 % EX OINT
1.0000 "application " | TOPICAL_OINTMENT | Freq: Two times a day (BID) | CUTANEOUS | Status: DC
  Administered 2016-12-13 – 2016-12-15 (×5): 1 via NASAL
  Filled 2016-12-13: qty 22

## 2016-12-13 MED ORDER — FENTANYL CITRATE (PF) 100 MCG/2ML IJ SOLN
INTRAMUSCULAR | Status: DC | PRN
  Administered 2016-12-13 (×5): 50 ug via INTRAVENOUS

## 2016-12-13 MED ORDER — HYDROMORPHONE HCL 1 MG/ML IJ SOLN
INTRAMUSCULAR | Status: AC
  Filled 2016-12-13: qty 1

## 2016-12-13 MED ORDER — PHENYLEPHRINE HCL 10 MG/ML IJ SOLN
INTRAMUSCULAR | Status: DC | PRN
  Administered 2016-12-13 (×2): 80 ug via INTRAVENOUS
  Administered 2016-12-13: 40 ug via INTRAVENOUS

## 2016-12-13 MED ORDER — ENOXAPARIN SODIUM 40 MG/0.4ML ~~LOC~~ SOLN
40.0000 mg | SUBCUTANEOUS | Status: DC
  Administered 2016-12-14 – 2016-12-15 (×2): 40 mg via SUBCUTANEOUS
  Filled 2016-12-13 (×2): qty 0.4

## 2016-12-13 MED ORDER — METOCLOPRAMIDE HCL 5 MG/ML IJ SOLN: 5.0000 mg | Freq: Three times a day (TID) | INTRAMUSCULAR | Status: DC | PRN

## 2016-12-13 MED ORDER — HYDROMORPHONE HCL 1 MG/ML IJ SOLN
0.2500 mg | INTRAMUSCULAR | Status: DC | PRN
  Administered 2016-12-13 (×2): 0.5 mg via INTRAVENOUS

## 2016-12-13 MED ORDER — PROPOFOL 10 MG/ML IV BOLUS
INTRAVENOUS | Status: DC | PRN
  Administered 2016-12-13: 120 mg via INTRAVENOUS

## 2016-12-13 MED ORDER — MIDAZOLAM HCL 2 MG/2ML IJ SOLN
INTRAMUSCULAR | Status: AC
  Filled 2016-12-13: qty 2

## 2016-12-13 MED ORDER — FENTANYL CITRATE (PF) 250 MCG/5ML IJ SOLN
INTRAMUSCULAR | Status: AC
  Filled 2016-12-13: qty 5

## 2016-12-13 MED ORDER — ACETAMINOPHEN 650 MG RE SUPP: 650.0000 mg | Freq: Four times a day (QID) | RECTAL | Status: DC | PRN

## 2016-12-13 MED ORDER — PROPOFOL 10 MG/ML IV BOLUS
INTRAVENOUS | Status: AC
  Filled 2016-12-13: qty 20

## 2016-12-13 MED ORDER — CEFAZOLIN SODIUM-DEXTROSE 2-4 GM/100ML-% IV SOLN
2.0000 g | INTRAVENOUS | Status: DC
  Filled 2016-12-13: qty 100

## 2016-12-13 MED ORDER — CHLORHEXIDINE GLUCONATE CLOTH 2 % EX PADS
6.0000 | MEDICATED_PAD | Freq: Every day | CUTANEOUS | Status: DC
  Administered 2016-12-13: 6 via TOPICAL

## 2016-12-13 MED ORDER — CHLORHEXIDINE GLUCONATE 4 % EX LIQD: 60.0000 mL | Freq: Once | CUTANEOUS | Status: DC

## 2016-12-13 MED ORDER — LACTATED RINGERS IV SOLN: INTRAVENOUS | Status: DC

## 2016-12-13 MED ORDER — ESMOLOL HCL 100 MG/10ML IV SOLN
INTRAVENOUS | Status: DC | PRN
  Administered 2016-12-13 (×2): 20 mg via INTRAVENOUS

## 2016-12-13 MED ORDER — LACTATED RINGERS IV SOLN
INTRAVENOUS | Status: DC | PRN
  Administered 2016-12-13 (×2): via INTRAVENOUS

## 2016-12-13 MED ORDER — CEFAZOLIN SODIUM-DEXTROSE 2-3 GM-% IV SOLR
INTRAVENOUS | Status: DC | PRN
  Administered 2016-12-13: 2 g via INTRAVENOUS

## 2016-12-13 MED ORDER — PHENYLEPHRINE HCL 10 MG/ML IJ SOLN
INTRAVENOUS | Status: DC | PRN
  Administered 2016-12-13: 25 ug/min via INTRAVENOUS

## 2016-12-13 MED ORDER — LIDOCAINE HCL (CARDIAC) 20 MG/ML IV SOLN
INTRAVENOUS | Status: DC | PRN
  Administered 2016-12-13: 50 mg via INTRAVENOUS

## 2016-12-13 MED ORDER — ONDANSETRON HCL 4 MG/2ML IJ SOLN
INTRAMUSCULAR | Status: DC | PRN
Start: 2016-12-13 — End: 2016-12-13
  Administered 2016-12-13: 4 mg via INTRAVENOUS

## 2016-12-13 MED ORDER — POVIDONE-IODINE 10 % EX SWAB: 2.0000 "application " | Freq: Once | CUTANEOUS | Status: DC

## 2016-12-13 MED ORDER — ACETAMINOPHEN 325 MG PO TABS
650.0000 mg | ORAL_TABLET | Freq: Four times a day (QID) | ORAL | Status: DC | PRN
  Administered 2016-12-13 – 2016-12-15 (×4): 650 mg via ORAL
  Filled 2016-12-13 (×4): qty 2

## 2016-12-13 SURGICAL SUPPLY — 52 items
ADH SKN CLS LQ APL DERMABOND (GAUZE/BANDAGES/DRESSINGS) ×1
ALCOHOL ISOPROPYL (RUBBING) (MISCELLANEOUS) ×3 IMPLANT
BIT DRILL 4.3MMS DISTAL GRDTED (BIT) ×1 IMPLANT
BNDG COHESIVE 4X5 TAN STRL (GAUZE/BANDAGES/DRESSINGS) IMPLANT
CHLORAPREP W/TINT 26ML (MISCELLANEOUS) ×3 IMPLANT
COVER PERINEAL POST (MISCELLANEOUS) ×3 IMPLANT
COVER SURGICAL LIGHT HANDLE (MISCELLANEOUS) ×3 IMPLANT
DERMABOND ADHESIVE PROPEN (GAUZE/BANDAGES/DRESSINGS) ×2
DERMABOND ADVANCED (GAUZE/BANDAGES/DRESSINGS) ×4
DERMABOND ADVANCED .7 DNX12 (GAUZE/BANDAGES/DRESSINGS) ×2 IMPLANT
DERMABOND ADVANCED .7 DNX6 (GAUZE/BANDAGES/DRESSINGS) ×1 IMPLANT
DRAPE C-ARM 42X72 X-RAY (DRAPES) ×3 IMPLANT
DRAPE C-ARMOR (DRAPES) ×3 IMPLANT
DRAPE HALF SHEET 40X57 (DRAPES) ×3 IMPLANT
DRAPE IMP U-DRAPE 54X76 (DRAPES) ×6 IMPLANT
DRAPE STERI IOBAN 125X83 (DRAPES) ×3 IMPLANT
DRAPE U-SHAPE 47X51 STRL (DRAPES) ×6 IMPLANT
DRAPE UNIVERSAL PACK (DRAPES) ×3 IMPLANT
DRILL 4.3MMS DISTAL GRADUATED (BIT) ×3
DRSG MEPILEX BORDER 4X4 (GAUZE/BANDAGES/DRESSINGS) ×3 IMPLANT
DRSG MEPILEX BORDER 4X8 (GAUZE/BANDAGES/DRESSINGS) ×3 IMPLANT
DRSG PAD ABDOMINAL 8X10 ST (GAUZE/BANDAGES/DRESSINGS) IMPLANT
ELECT REM PT RETURN 9FT ADLT (ELECTROSURGICAL) ×3
ELECTRODE REM PT RTRN 9FT ADLT (ELECTROSURGICAL) ×1 IMPLANT
FACESHIELD WRAPAROUND (MASK) ×3 IMPLANT
GLOVE BIO SURGEON STRL SZ8.5 (GLOVE) ×6 IMPLANT
GLOVE BIOGEL PI IND STRL 8.5 (GLOVE) ×1 IMPLANT
GLOVE BIOGEL PI INDICATOR 8.5 (GLOVE) ×2
GOWN STRL REUS W/ TWL LRG LVL3 (GOWN DISPOSABLE) ×2 IMPLANT
GOWN STRL REUS W/TWL 2XL LVL3 (GOWN DISPOSABLE) ×3 IMPLANT
GOWN STRL REUS W/TWL LRG LVL3 (GOWN DISPOSABLE) ×6
GUIDEPIN 3.2X17.5 THRD DISP (PIN) ×3 IMPLANT
GUIDEWIRE BALL NOSE 100CM (WIRE) ×3 IMPLANT
HFN LAG SCREW 10.5MM X 85MM (Screw) ×3 IMPLANT
HFN LH 130 DEG 11MM X 380MM (Orthopedic Implant) ×3 IMPLANT
KIT ROOM TURNOVER OR (KITS) ×3 IMPLANT
MANIFOLD NEPTUNE II (INSTRUMENTS) ×3 IMPLANT
MARKER SKIN DUAL TIP RULER LAB (MISCELLANEOUS) ×3 IMPLANT
NS IRRIG 1000ML POUR BTL (IV SOLUTION) ×3 IMPLANT
PACK GENERAL/GYN (CUSTOM PROCEDURE TRAY) ×3 IMPLANT
PAD ARMBOARD 7.5X6 YLW CONV (MISCELLANEOUS) ×6 IMPLANT
PADDING CAST ABS 4INX4YD NS (CAST SUPPLIES)
PADDING CAST ABS COTTON 4X4 ST (CAST SUPPLIES) IMPLANT
SCREW BONE CORTICAL 5.0X36 (Screw) ×3 IMPLANT
SCREWDRIVER DIST 3.5 281001019 (MISCELLANEOUS) ×3 IMPLANT
SCREWDRIVER HEX TIP 3.5MM (MISCELLANEOUS) ×3 IMPLANT
SUT MNCRL AB 3-0 PS2 27 (SUTURE) ×3 IMPLANT
SUT MON AB 2-0 CT1 27 (SUTURE) ×3 IMPLANT
SUT VIC AB 1 CT1 27 (SUTURE) ×3
SUT VIC AB 1 CT1 27XBRD ANBCTR (SUTURE) ×1 IMPLANT
TOWEL OR 17X24 6PK STRL BLUE (TOWEL DISPOSABLE) ×3 IMPLANT
TOWEL OR 17X26 10 PK STRL BLUE (TOWEL DISPOSABLE) ×3 IMPLANT

## 2016-12-13 NOTE — Anesthesia Postprocedure Evaluation (Signed)
Anesthesia Post Note  Patient: Naval architect  Procedure(s) Performed: Procedure(s) (LRB): INTRAMEDULLARY (IM) NAIL INTERTROCHANTRIC (Left)     Patient location during evaluation: PACU Anesthesia Type: General Level of consciousness: awake and alert Pain management: pain level controlled Vital Signs Assessment: post-procedure vital signs reviewed and stable Respiratory status: spontaneous breathing, nonlabored ventilation, respiratory function stable and patient connected to nasal cannula oxygen Cardiovascular status: blood pressure returned to baseline and stable Postop Assessment: no signs of nausea or vomiting Anesthetic complications: no    Last Vitals:  Vitals:   12/13/16 1120 12/13/16 1138  BP:    Pulse:    Resp:    Temp: 36.6 C   SpO2:  96%    Last Pain:  Vitals:   12/13/16 1010  TempSrc:   PainSc: Asleep                 Shelton Silvas

## 2016-12-13 NOTE — Progress Notes (Signed)
Orthopedic Tech Progress Note Patient Details:  Alexis Floyd 07/31/1965 [id number]  Ortho Devices Ortho Device/Splint Location: trapeze bar patient helper   Alexis Floyd 12/13/2016, 11:42 AM

## 2016-12-13 NOTE — Transfer of Care (Signed)
Immediate Anesthesia Transfer of Care Note  Patient: Davian Garbutt  Procedure(s) Performed: Procedure(s): INTRAMEDULLARY (IM) NAIL INTERTROCHANTRIC (Left)  Patient Location: PACU  Anesthesia Type:General  Level of Consciousness: awake  Airway & Oxygen Therapy: Patient Spontanous Breathing and Patient connected to nasal cannula oxygen  Post-op Assessment: Report given to RN and Post -op Vital signs reviewed and stable  Post vital signs: Reviewed and stable  Last Vitals:  Vitals:   12/13/16 0300 12/13/16 0925  BP: 121/63   Pulse: (!) 114   Resp: 18   Temp: 37.4 C (!) (P) 36.3 C  SpO2: 96%     Last Pain:  Vitals:   12/13/16 0925  TempSrc:   PainSc: (P) Asleep      Patients Stated Pain Goal: 10 (12/12/16 0434)  Complications: No apparent anesthesia complications

## 2016-12-13 NOTE — Op Note (Signed)
OPERATIVE REPORT  SURGEON: Samson Frederic, MD   ASSISTANT: Staff.  PREOPERATIVE DIAGNOSIS: Left pertrochanteric femur fracture.   POSTOPERATIVE DIAGNOSIS: Left pertrochanteric femur fracture.   PROCEDURE: Intramedullary fixation, Left femur.   IMPLANTS: Biomet Affixus Hip Fracture Nail, 11 by 380 mm, 130 degrees. 85 mm Hip Fracture Nail Lag Screw. 5 x 36 mm distal interlocking screw 1.  ANESTHESIA:  General  ESTIMATED BLOOD LOSS: 300 mL.  ANTIBIOTICS: 2 g Ancef.  DRAINS: None.  COMPLICATIONS: None.   CONDITION: PACU - hemodynamically stable.  BRIEF CLINICAL NOTE: Alexis Floyd is a 51 y.o. female who presented with an intertrochanteric femur fracture. The patient was admitted to the hospitalist service and underwent perioperative risk stratification and medical optimization. The risks, benefits, and alternatives to the procedure were explained, and the patient elected to proceed.  PROCEDURE IN DETAIL: Surgical site was marked by myself. The patient was taken to the operating room and anesthesia was induced on the bed. The patient was then transferred to the [facility] table and the nonoperative lower extremity was scissored underneath the operative side. The fracture was reduced with traction, internal rotation, and adduction. The hip was prepped and draped in the normal sterile surgical fashion. Timeout was called verifying side and site of surgery. Preop antibiotics were given with 60 minutes of beginning the procedure.  Fluoroscopy was used to define the patient's anatomy. A 4 cm incision was made just proximal to the tip of the greater trochanter. The awl was used to obtain the standard starting point for a trochanteric entry nail under fluoroscopic control. The guidepin was placed. The entry reamer was used to open the proximal femur.  I placed the guidewire to the level of the physeal scar of the knee. I measured the length of the guidewire. Sequential reaming was performed up  to a size 12.5 mm with excellent chatter. Therefore, a size 11 by 380 mm nail was selected and assembled to the jig on the back table. The nail was placed without any difficulty. Through a separate stab incision, the cannula was placed down to the bone in preparation for the cephalomedullary device. A guidepin was placed into the femoral head using AP and lateral fluoroscopy views. The pin was measured, and then reaming was performed to the appropriate depth. The lag screw was inserted to the appropriate depth. The fracture was compressed through the jig. The setscrew was tightened and then loosened one quarter turn. Using perfect circle technique, a distal interlocking screw was placed. The jig was removed. Final AP and lateral fluoroscopy views were obtained to confirm fracture reduction and hardware placement. Tip apex distance was appropriate. There was no chondral penetration.  The wounds were copiously irrigated with saline. The wound was closed in layers with #1 Vicryl for the fascia, 2-0 Monocryl for the deep dermal layer, and 3-0 Monocryl subcuticular stitch. Glue was applied to the skin. Once the glue was fully hardened, sterile dressing was applied. The patient was then awakened from anesthesia and taken to the PACU in stable condition. Sponge needle and instrument counts were correct at the end of the case 2. There were no known complications.  We will readmit the patient to the hospitalist. Weightbearing status will be touchdown weightbearing with a walker. We will begin Lovenox for DVT prophylaxis. The patient will work with physical therapy and undergo disposition planning.

## 2016-12-13 NOTE — Anesthesia Preprocedure Evaluation (Addendum)
Anesthesia Evaluation  Patient identified by MRN, date of birth, ID band Patient confused    Reviewed: Allergy & Precautions, NPO status , Patient's Chart, lab work & pertinent test results  Airway Mallampati: I  TM Distance: >3 FB Neck ROM: Full    Dental  (+) Upper Dentures, Dental Advisory Given   Pulmonary neg pulmonary ROS, Current Smoker,    breath sounds clear to auscultation       Cardiovascular negative cardio ROS   Rhythm:Regular Rate:Normal     Neuro/Psych  Headaches, PSYCHIATRIC DISORDERS Depression    GI/Hepatic negative GI ROS, (+) Cirrhosis       , Hepatitis -, C  Endo/Other  negative endocrine ROSdiabetes  Renal/GU negative Renal ROS     Musculoskeletal negative musculoskeletal ROS (+)   Abdominal   Peds  Hematology negative hematology ROS (+)   Anesthesia Other Findings Day of surgery medications reviewed with the patient.  Reproductive/Obstetrics                             Anesthesia Physical Anesthesia Plan  ASA: III  Anesthesia Plan: General   Post-op Pain Management:    Induction: Intravenous  PONV Risk Score and Plan: 3 and Ondansetron, Dexamethasone, Midazolam and Propofol infusion  Airway Management Planned: Oral ETT  Additional Equipment:   Intra-op Plan:   Post-operative Plan: Extubation in OR  Informed Consent: I have reviewed the patients History and Physical, chart, labs and discussed the procedure including the risks, benefits and alternatives for the proposed anesthesia with the patient or authorized representative who has indicated his/her understanding and acceptance.   Dental advisory given and Consent reviewed with POA  Plan Discussed with: CRNA  Anesthesia Plan Comments:        Anesthesia Quick Evaluation

## 2016-12-13 NOTE — Progress Notes (Signed)
Patient states her mom is legal guardian and has to make all decisions for patient.
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
## 2016-12-13 NOTE — Anesthesia Procedure Notes (Signed)
Procedure Name: Intubation Date/Time: 12/13/2016 7:58 AM Performed by: Clearnce Sorrel Pre-anesthesia Checklist: Patient identified, Emergency Drugs available, Suction available, Patient being monitored and Timeout performed Patient Re-evaluated:Patient Re-evaluated prior to induction Oxygen Delivery Method: Circle system utilized Preoxygenation: Pre-oxygenation with 100% oxygen Induction Type: IV induction Ventilation: Mask ventilation without difficulty and Oral airway inserted - appropriate to patient size Laryngoscope Size: Mac and 3 Grade View: Grade I Tube type: Oral Tube size: 7.0 mm Number of attempts: 1 Airway Equipment and Method: Stylet Placement Confirmation: ETT inserted through vocal cords under direct vision,  positive ETCO2 and breath sounds checked- equal and bilateral Secured at: 22 cm Tube secured with: Tape Dental Injury: Teeth and Oropharynx as per pre-operative assessment

## 2016-12-13 NOTE — Discharge Instructions (Signed)
°Dr. Adelle Zachar °Adult Hip & Knee Specialist °[facility] °[address] °[place], [place] [address] °[phone] ° ° °POSTOPERATIVE DIRECTIONS ° ° ° °Hip Rehabilitation, Guidelines Following Surgery  ° °WEIGHT BEARING °Partial weight bearing with assist device as directed.  touch down weight bearing (30%) ° ° °HOME CARE INSTRUCTIONS  °Remove items at home which could result in a fall. This includes throw rugs or furniture in walking pathways.  °Continue medications as instructed at time of discharge. °· You may have some home medications which will be placed on hold until you complete the course of blood thinner medication. °· 4 days after discharge, you may start showering. No tub baths or soaking your incisions. °Do not put on socks or shoes without following the instructions of your caregivers.   °Sit on chairs with arms. Use the chair arms to help push yourself up when arising.  °Arrange for the use of a toilet seat elevator so you are not sitting low.  °· Walk with walker as instructed.  °You may resume a sexual relationship in one month or when given the OK by your caregiver.  °Use walker as long as suggested by your caregivers.  °Avoid periods of inactivity such as sitting longer than an hour when not asleep. This helps prevent blood clots.  °You may return to work once you are cleared by your surgeon.  °Do not drive a car for 6 weeks or until released by your surgeon.  °Do not drive while taking narcotics.  °Wear elastic stockings for two weeks following surgery during the day but you may remove then at night.  °Make sure you keep all of your appointments after your operation with all of your doctors and caregivers. You should call the office at the above phone number and make an appointment for approximately two weeks after the date of your surgery. °Please pick up a stool softener and laxative for home use as long as you are requiring pain medications. °· ICE to the affected  hip every three hours for 30 minutes at a time and then as needed for pain and swelling. Continue to use ice on the hip for pain and swelling from surgery. You may notice swelling that will progress down to the foot and ankle.  This is normal after surgery.  Elevate the leg when you are not up walking on it.   °It is important for you to complete the blood thinner medication as prescribed by your doctor. °· Continue to use the breathing machine which will help keep your temperature down.  It is common for your temperature to cycle up and down following surgery, especially at night when you are not up moving around and exerting yourself.  The breathing machine keeps your lungs expanded and your temperature down. ° °RANGE OF MOTION AND STRENGTHENING EXERCISES  °These exercises are designed to help you keep full movement of your hip joint. Follow your caregiver's or physical therapist's instructions. Perform all exercises about fifteen times, three times per day or as directed. Exercise both hips, even if you have had only one joint replacement. These exercises can be done on a training (exercise) mat, on the floor, on a table or on a bed. Use whatever works the best and is most comfortable for you. Use music or television while you are exercising so that the exercises are a pleasant break in your day. This will make your life better with the exercises acting as a break in routine you   can look forward to.  °Lying on your back, slowly slide your foot toward your buttocks, raising your knee up off the floor. Then slowly slide your foot back down until your leg is straight again.  °Lying on your back spread your legs as far apart as you can without causing discomfort.  °Lying on your side, raise your upper leg and foot straight up from the floor as far as is comfortable. Slowly lower the leg and repeat.  °Lying on your back, tighten up the muscle in the front of your thigh (quadriceps muscles). You can do this by keeping  your leg straight and trying to raise your heel off the floor. This helps strengthen the largest muscle supporting your knee.  °Lying on your back, tighten up the muscles of your buttocks both with the legs straight and with the knee bent at a comfortable angle while keeping your heel on the floor.  ° °SKILLED REHAB INSTRUCTIONS: °If the patient is transferred to a skilled rehab facility following release from the hospital, a list of the current medications will be sent to the facility for the patient to continue.  When discharged from the skilled rehab facility, please have the facility set up the patient's Home Health Physical Therapy prior to being released. Also, the skilled facility will be responsible for providing the patient with their medications at time of release from the facility to include their pain medication and their blood thinner medication. If the patient is still at the rehab facility at time of the two week follow up appointment, the skilled rehab facility will also need to assist the patient in arranging follow up appointment in our office and any transportation needs. ° °MAKE SURE YOU:  °Understand these instructions.  °Will watch your condition.  °Will get help right away if you are not doing well or get worse. ° °Pick up stool softner and laxative for home use following surgery while on pain medications. °Daily dry dressing changes as needed. °In 4 days, you may remove your dressings and begin taking showers - no tub baths or soaking the incisions. °Continue to use ice for pain and swelling after surgery. °Do not use any lotions or creams on the incision until instructed by your surgeon. ° ° °

## 2016-12-13 NOTE — Progress Notes (Signed)
PROGRESS NOTE    Alexis Floyd  WUJ:[id number] DOB: February 03, 1966 DOA: 12/11/2016 PCP: Abner Greenspan, MD  Brief Narrative:  51 y.o. female with medical history significant for alcoholism in remission, insulin-dependent diabetes mellitus, chronic migraines, liver cirrhosis, and chronic thrombocytopenia, now presenting to the emergency department with severe left hip pain after a mechanical fall. Patient reports that she was in her usual state of health when she tripped over something and fell onto her left side, experiencing severe pain at the left hip.   Assessment & Plan:   Principal Problem:   Closed left hip fracture, initial encounter [facility]) - Orthopedic surgery on board and managing. Pt is s/p operation today. - Continue supportive therapy  Active Problems:   Other cirrhosis of liver (HCC) - Stable AST and ALT were within normal limits    Thrombocytopenia (HCC) - Most likely secondary to liver disease    Hyponatremia - Mild and improved from last check    Diabetes mellitus, type II, insulin dependent (HCC) -Once able to advance diet will plan on advancing the diabetic diet. Continue current insulin regimen.    Chronic migraine - stable no HA reported.  DVT prophylaxis: Per orthopaedic surgery Code Status: Full Family Communication: none at bedside Disposition Plan: Once cleared by specialist.   Consultants:   ortho   Procedures: per ortho   Antimicrobials: None   Subjective: Pain well controlled per patient no new complaints reported  Objective: Vitals:   12/13/16 1055 12/13/16 1120 12/13/16 1138 12/13/16 1544  BP:    133/68  Pulse:    (!) 120  Resp:    18  Temp: 97.9 F (36.6 C) 97.9 F (36.6 C)  98.2 F (36.8 C)  TempSrc:    Oral  SpO2:   96% 92%  Weight:      Height:        Intake/Output Summary (Last 24 hours) at 12/13/16 1614 Last data filed at 12/13/16 1100  Gross per 24 hour  Intake             1280 ml  Output             2600 ml    Net            -1320 ml   Filed Weights   12/11/16 1948  Weight: 59.4 kg (131 lb)    Examination:  General exam: In NAD, alert and awake Respiratory system: Clear to auscultation. Respiratory effort normal. Equal chest rise. Cardiovascular system: S1 & S2 heard, RRR. No JVD, murmurs, rubs, gallops  Gastrointestinal system: Abdomen is nondistended, soft and nontender. No organomegaly or masses felt. Normal bowel sounds heard. Central nervous system: Alert and oriented. No focal neurological deficits. Extremities: warm Skin: No rashes, lesions or ulcers, on limited exam. Psychiatry:  Mood & affect appropriate  Data Reviewed: I have personally reviewed following labs and imaging studies  CBC:  Recent Labs Lab 12/11/16 2102 12/12/16 0036  WBC 4.2 5.6  NEUTROABS 3.1  --   HGB 13.5 13.4  HCT 36.8 36.8  MCV 92.7 91.1  PLT 51* 61*   Basic Metabolic Panel:  Recent Labs Lab 12/11/16 2102 12/12/16 0036  NA 128* 134*  K 3.7 3.7  CL 98* 101  CO2 22 23  GLUCOSE 405* 314*  BUN 6 5*  CREATININE 0.51 0.43*  CALCIUM 8.9 8.9   GFR: Estimated Creatinine Clearance: 79.6 mL/min (A) (by C-G formula based on SCr of 0.43 mg/dL (L)). Liver Function Tests:  Recent Labs Lab  12/12/16 0036  AST 41  ALT 36  ALKPHOS 171*  BILITOT 1.7*  PROT 7.3  ALBUMIN 3.3*   No results for input(s): LIPASE, AMYLASE in the last 168 hours. No results for input(s): AMMONIA in the last 168 hours. Coagulation Profile:  Recent Labs Lab 12/12/16 0036  INR 1.06   Cardiac Enzymes: No results for input(s): CKTOTAL, CKMB, CKMBINDEX, TROPONINI in the last 168 hours. BNP (last 3 results) No results for input(s): PROBNP in the last 8760 hours. HbA1C:  Recent Labs  12/12/16 0036  HGBA1C 12.7*   CBG:  Recent Labs Lab 12/12/16 2105 12/13/16 0046 12/13/16 0403 12/13/16 0701 12/13/16 1236  GLUCAP 132* 180* 165* 133* 204*   Lipid Profile: No results for input(s): CHOL, HDL, LDLCALC,  TRIG, CHOLHDL, LDLDIRECT in the last 72 hours. Thyroid Function Tests: No results for input(s): TSH, T4TOTAL, FREET4, T3FREE, THYROIDAB in the last 72 hours. Anemia Panel: No results for input(s): VITAMINB12, FOLATE, FERRITIN, TIBC, IRON, RETICCTPCT in the last 72 hours. Sepsis Labs: No results for input(s): PROCALCITON, LATICACIDVEN in the last 168 hours.  Recent Results (from the past 240 hour(s))  Surgical pcr screen     Status: Abnormal   Collection Time: 12/12/16 10:31 PM  Result Value Ref Range Status   MRSA, PCR NEGATIVE NEGATIVE Final   Staphylococcus aureus POSITIVE (A) NEGATIVE Final    Comment:        The Xpert SA Assay (FDA approved for NASAL specimens in patients over 26 years of age), is one component of a comprehensive surveillance program.  Test performance has been validated by [facility] for patients greater than or equal to 16 year old. It is not intended to diagnose infection nor to guide or monitor treatment.      Radiology Studies: Dg Chest 1 View  Result Date: 12/11/2016 CLINICAL DATA:  Preoperative exam EXAM: CHEST 1 VIEW COMPARISON:  None. FINDINGS: The heart size and mediastinal contours are within normal limits. Both lungs are clear. The visualized skeletal structures are unremarkable. IMPRESSION: No active disease. Electronically Signed   By: Deatra Robinson M.D.   On: 12/11/2016 22:01   Dg C-arm 1-60 Min  Result Date: 12/13/2016 CLINICAL DATA:  Left hip intertrochanteric fracture EXAM: DG C-ARM 61-120 MIN; LEFT FEMUR 2 VIEWS CONTRAST:  None. FLUOROSCOPY TIME:  Fluoroscopy Time:  1 minutes 11 seconds COMPARISON:  12/11/2016 FINDINGS: Four spot fluoroscopic intraoperative views demonstrate left femur intramedullary rod and screw fixation to reduce the left femur intertrochanteric fracture. Anatomic alignment. No complicating feature. IMPRESSION: Improved alignment following ORIF of the left hip intertrochanteric fracture Electronically Signed   By: Judie Petit.   Shick M.D.   On: 12/13/2016 09:04   Dg Hip Unilat With Pelvis 1v Left  Result Date: 12/12/2016 CLINICAL DATA:  Left femoral fracture EXAM: DG HIP (WITH OR WITHOUT PELVIS) 1V*L* COMPARISON:  None. FINDINGS: Comminuted left intertrochanteric fracture with mild angulation and displacement. No hip dislocation. No aggressive osseous lesion. IMPRESSION: Comminuted left intertrochanteric fracture with mild angulation and displacement. Electronically Signed   By: Elige Ko   On: 12/12/2016 15:04   Dg Hip Unilat W Or Wo Pelvis 2-3 Views Left  Result Date: 12/11/2016 CLINICAL DATA:  51 year old female with history of trauma from a fall complaining of pain in the left hip and left leg. EXAM: DG HIP (WITH OR WITHOUT PELVIS) 2-3V LEFT COMPARISON:  No priors. FINDINGS: Two nonstandard views are submitted for evaluation, which demonstrates what appears to be an impacted subtrochanteric fracture of the  left hip with some varus angulation. There is also approximately 9 mm of medial displacement. Left femoral head appears to be located on this limited examination. Visualized portions of the bony pelvis and right proximal femur also appear intact. IMPRESSION: 1. Impacted angulated probable subtrochanteric fracture of the left hip, as discussed above. The possibility of an intertrochanteric component of the fracture is not excluded. Electronically Signed   By: Trudie Reed M.D.   On: 12/11/2016 20:41   Dg Femur Min 2 Views Left  Result Date: 12/13/2016 CLINICAL DATA:  Left hip intertrochanteric fracture EXAM: DG C-ARM 61-120 MIN; LEFT FEMUR 2 VIEWS CONTRAST:  None. FLUOROSCOPY TIME:  Fluoroscopy Time:  1 minutes 11 seconds COMPARISON:  12/11/2016 FINDINGS: Four spot fluoroscopic intraoperative views demonstrate left femur intramedullary rod and screw fixation to reduce the left femur intertrochanteric fracture. Anatomic alignment. No complicating feature. IMPRESSION: Improved alignment following ORIF of the left hip  intertrochanteric fracture Electronically Signed   By: Judie Petit.  Shick M.D.   On: 12/13/2016 09:04   Dg Femur Port Min 2 Views Left  Result Date: 12/13/2016 CLINICAL DATA:  Status post placement of intramedullary nail. EXAM: LEFT FEMUR PORTABLE 2 VIEWS COMPARISON:  December 13, 2016 FINDINGS: A gamma nail and intramedullary rod have been placed across the left hip fracture. A distal interlocking screw is identified. Hardware is in good position. Soft tissue gas is consistent with postsurgical change. No other acute abnormalities. No dislocation. IMPRESSION: Surgical hardware placement as above. No acute abnormalities otherwise seen. Electronically Signed   By: Gerome Sam III M.D   On: 12/13/2016 10:49   Scheduled Meds: . amitriptyline  100 mg Oral QHS  . [START ON 12/14/2016] enoxaparin (LOVENOX) injection  40 mg Subcutaneous Q24H  . feeding supplement (GLUCERNA SHAKE)  237 mL Oral BID BM  . gabapentin  100 mg Oral Daily  . HYDROmorphone      . insulin aspart  0-15 Units Subcutaneous Q4H  . insulin detemir  20 Units Subcutaneous QHS  . mupirocin ointment  1 application Nasal BID  . vitamin C  1,000 mg Oral Daily   Continuous Infusions: . sodium chloride       LOS: 2 days   Time spent:> 35  Penny Pia, MD [facility] Pager [phone]  If 7PM-7AM, please contact night-coverage [email or web address] Password [facility] 12/13/2016, 4:14 PM

## 2016-12-14 LAB — BASIC METABOLIC PANEL
ANION GAP: 7 (ref 5–15)
CALCIUM: 8 mg/dL — AB (ref 8.9–10.3)
CO2: 25 mmol/L (ref 22–32)
Chloride: 103 mmol/L (ref 101–111)
Creatinine, Ser: 0.4 mg/dL — ABNORMAL LOW (ref 0.44–1.00)
GFR calc Af Amer: >60 mL/min (ref >60)
GLUCOSE: 228 mg/dL — AB (ref 65–99)
POTASSIUM: 3.8 mmol/L (ref 3.5–5.1)
Sodium: 135 mmol/L (ref 135–145)

## 2016-12-14 LAB — CBC
HEMATOCRIT: 28.1 % — AB (ref 36.0–46.0)
HEMOGLOBIN: 9.9 g/dL — AB (ref 12.0–15.0)
MCH: 33.2 pg (ref 26.0–34.0)
MCHC: 35.2 g/dL (ref 30.0–36.0)
MCV: 94.3 fL (ref 78.0–100.0)
Platelets: 49 10*3/uL — ABNORMAL LOW (ref 150–400)
RBC: 2.98 MIL/uL — AB (ref 3.87–5.11)
RDW: 14.4 % (ref 11.5–15.5)
WBC: 3.6 10*3/uL — ABNORMAL LOW (ref 4.0–10.5)

## 2016-12-14 LAB — GLUCOSE, CAPILLARY
GLUCOSE-CAPILLARY: 161 mg/dL — AB (ref 65–99)
GLUCOSE-CAPILLARY: 224 mg/dL — AB (ref 65–99)
GLUCOSE-CAPILLARY: 338 mg/dL — AB (ref 65–99)
Glucose-Capillary: 203 mg/dL — ABNORMAL HIGH (ref 65–99)
Glucose-Capillary: 216 mg/dL — ABNORMAL HIGH (ref 65–99)
Glucose-Capillary: 222 mg/dL — ABNORMAL HIGH (ref 65–99)

## 2016-12-14 MED ORDER — MORPHINE SULFATE (PF) 4 MG/ML IV SOLN
2.0000 mg | INTRAVENOUS | Status: DC | PRN
  Administered 2016-12-14 – 2016-12-15 (×4): 2 mg via INTRAVENOUS
  Filled 2016-12-14 (×4): qty 1

## 2016-12-14 MED ORDER — KETOROLAC TROMETHAMINE 30 MG/ML IJ SOLN
30.0000 mg | Freq: Once | INTRAMUSCULAR | Status: AC
  Administered 2016-12-14: 30 mg via INTRAVENOUS
  Filled 2016-12-14: qty 1

## 2016-12-14 NOTE — Progress Notes (Signed)
CSW received a call from pt's RN forwarding a request from pt's family for assistance with placement for SNF.  CSW spoke with family after reviewing the chart and counseled family pt was given PT/OT recommendation for Home Health.    CSW counseled pt and pt's family about pt's need to see a PCP for an FL-2 and a SNF recommendation.  CSW requested from pt's RN that a Case Management consult with order for face-to-face be placed to assist pt upon returning home.    CSW provided family with SNf/ALF  Family was appreciative and thanked the CSW.  Dorothe Pea. Zinedine Ellner, LCSWA, Darcey Nora, CSI Clinical Social Worker Ph: [phone]

## 2016-12-14 NOTE — Progress Notes (Signed)
PROGRESS NOTE    Alexis Floyd  WPY:[id number] DOB: 05-Jul-1965 DOA: 12/11/2016 PCP: Abner Greenspan, MD  Brief Narrative:  51 y.o. female with medical history significant for alcoholism in remission, insulin-dependent diabetes mellitus, chronic migraines, liver cirrhosis, and chronic thrombocytopenia, now presenting to the emergency department with severe left hip pain after a mechanical fall. Patient reports that she was in her usual state of health when she tripped over something and fell onto her left side, experiencing severe pain at the left hip.  Assessment & Plan:   Principal Problem:   Closed left hip fracture, initial encounter [facility]) - Orthopedic surgery on board and managing. Pt is s/p operation IMN L hip - Continue supportive therapy  Active Problems:   Other cirrhosis of liver (HCC) - Stable AST and ALT were within normal limits    Thrombocytopenia (HCC) - Most likely secondary to liver disease    Hyponatremia - Mild and improved from last check    Diabetes mellitus, type II, insulin dependent (HCC) - Once able to advance diet will plan on advancing the diabetic diet. Continue current insulin regimen.    Chronic migraine - stable no HA reported.  DVT prophylaxis: Per orthopaedic surgery Code Status: Full Family Communication: none at bedside Disposition Plan: Once cleared by specialist.   Consultants:   ortho   Procedures: per ortho   Antimicrobials: None   Subjective: Pt has no new complaints reported today.  Objective: Vitals:   12/13/16 1900 12/13/16 2300 12/14/16 0300 12/14/16 1059  BP: 136/75 113/60 121/72 119/72  Pulse: (!) 129 (!) 126 (!) 116 (!) 111  Resp: 18 16 18 18   Temp: 99.9 F (37.7 C) 99.1 F (37.3 C) (!) 100.4 F (38 C) 99 F (37.2 C)  TempSrc: Oral Oral Oral Oral  SpO2: 99% 96% 93% 100%  Weight:      Height:        Intake/Output Summary (Last 24 hours) at 12/14/16 1545 Last data filed at 12/14/16 1100  Gross per 24  hour  Intake              240 ml  Output             4225 ml  Net            -3985 ml   Filed Weights   12/11/16 1948  Weight: 59.4 kg (131 lb)    Examination:exam unchanged when compared 12/14/18  General exam: In NAD, alert and awake Respiratory system: Clear to auscultation. Respiratory effort normal. Equal chest rise. Cardiovascular system: S1 & S2 heard, RRR. No JVD, murmurs, rubs, gallops  Gastrointestinal system: Abdomen is nondistended, soft and nontender. No organomegaly or masses felt. Normal bowel sounds heard. Central nervous system: Alert and oriented. No focal neurological deficits. Extremities: warm Skin: No rashes, lesions or ulcers, on limited exam. Psychiatry:  Mood & affect appropriate  Data Reviewed: I have personally reviewed following labs and imaging studies  CBC:  Recent Labs Lab 12/11/16 2102 12/12/16 0036 12/14/16 0509  WBC 4.2 5.6 3.6*  NEUTROABS 3.1  --   --   HGB 13.5 13.4 9.9*  HCT 36.8 36.8 28.1*  MCV 92.7 91.1 94.3  PLT 51* 61* 49*   Basic Metabolic Panel:  Recent Labs Lab 12/11/16 2102 12/12/16 0036 12/14/16 0509  NA 128* 134* 135  K 3.7 3.7 3.8  CL 98* 101 103  CO2 22 23 25   GLUCOSE 405* 314* 228*  BUN 6 5* <5*  CREATININE 0.51 0.43* 0.40*  CALCIUM 8.9 8.9 8.0*   GFR: Estimated Creatinine Clearance: 79.6 mL/min (A) (by C-G formula based on SCr of 0.4 mg/dL (L)). Liver Function Tests:  Recent Labs Lab 12/12/16 0036  AST 41  ALT 36  ALKPHOS 171*  BILITOT 1.7*  PROT 7.3  ALBUMIN 3.3*   No results for input(s): LIPASE, AMYLASE in the last 168 hours. No results for input(s): AMMONIA in the last 168 hours. Coagulation Profile:  Recent Labs Lab 12/12/16 0036  INR 1.06   Cardiac Enzymes: No results for input(s): CKTOTAL, CKMB, CKMBINDEX, TROPONINI in the last 168 hours. BNP (last 3 results) No results for input(s): PROBNP in the last 8760 hours. HbA1C:  Recent Labs  12/12/16 0036  HGBA1C 12.7*    CBG:  Recent Labs Lab 12/13/16 2036 12/14/16 0007 12/14/16 0437 12/14/16 0825 12/14/16 1209  GLUCAP 260* 216* 222* 161* 224*   Lipid Profile: No results for input(s): CHOL, HDL, LDLCALC, TRIG, CHOLHDL, LDLDIRECT in the last 72 hours. Thyroid Function Tests: No results for input(s): TSH, T4TOTAL, FREET4, T3FREE, THYROIDAB in the last 72 hours. Anemia Panel: No results for input(s): VITAMINB12, FOLATE, FERRITIN, TIBC, IRON, RETICCTPCT in the last 72 hours. Sepsis Labs: No results for input(s): PROCALCITON, LATICACIDVEN in the last 168 hours.  Recent Results (from the past 240 hour(s))  Surgical pcr screen     Status: Abnormal   Collection Time: 12/12/16 10:31 PM  Result Value Ref Range Status   MRSA, PCR NEGATIVE NEGATIVE Final   Staphylococcus aureus POSITIVE (A) NEGATIVE Final    Comment:        The Xpert SA Assay (FDA approved for NASAL specimens in patients over 66 years of age), is one component of a comprehensive surveillance program.  Test performance has been validated by [facility] for patients greater than or equal to 60 year old. It is not intended to diagnose infection nor to guide or monitor treatment.      Radiology Studies: Dg C-arm 1-60 Min  Result Date: 12/13/2016 CLINICAL DATA:  Left hip intertrochanteric fracture EXAM: DG C-ARM 61-120 MIN; LEFT FEMUR 2 VIEWS CONTRAST:  None. FLUOROSCOPY TIME:  Fluoroscopy Time:  1 minutes 11 seconds COMPARISON:  12/11/2016 FINDINGS: Four spot fluoroscopic intraoperative views demonstrate left femur intramedullary rod and screw fixation to reduce the left femur intertrochanteric fracture. Anatomic alignment. No complicating feature. IMPRESSION: Improved alignment following ORIF of the left hip intertrochanteric fracture Electronically Signed   By: Judie Petit.  Shick M.D.   On: 12/13/2016 09:04   Dg Femur Min 2 Views Left  Result Date: 12/13/2016 CLINICAL DATA:  Left hip intertrochanteric fracture EXAM: DG C-ARM 61-120 MIN;  LEFT FEMUR 2 VIEWS CONTRAST:  None. FLUOROSCOPY TIME:  Fluoroscopy Time:  1 minutes 11 seconds COMPARISON:  12/11/2016 FINDINGS: Four spot fluoroscopic intraoperative views demonstrate left femur intramedullary rod and screw fixation to reduce the left femur intertrochanteric fracture. Anatomic alignment. No complicating feature. IMPRESSION: Improved alignment following ORIF of the left hip intertrochanteric fracture Electronically Signed   By: Judie Petit.  Shick M.D.   On: 12/13/2016 09:04   Dg Femur Port Min 2 Views Left  Result Date: 12/13/2016 CLINICAL DATA:  Status post placement of intramedullary nail. EXAM: LEFT FEMUR PORTABLE 2 VIEWS COMPARISON:  December 13, 2016 FINDINGS: A gamma nail and intramedullary rod have been placed across the left hip fracture. A distal interlocking screw is identified. Hardware is in good position. Soft tissue gas is consistent with postsurgical change. No other acute abnormalities. No dislocation. IMPRESSION: Surgical hardware placement  as above. No acute abnormalities otherwise seen. Electronically Signed   By: Gerome Sam III M.D   On: 12/13/2016 10:49   Scheduled Meds: . amitriptyline  100 mg Oral QHS  . enoxaparin (LOVENOX) injection  40 mg Subcutaneous Q24H  . feeding supplement (GLUCERNA SHAKE)  237 mL Oral BID BM  . gabapentin  100 mg Oral Daily  . insulin aspart  0-15 Units Subcutaneous Q4H  . insulin detemir  20 Units Subcutaneous QHS  . mupirocin ointment  1 application Nasal BID  . vitamin C  1,000 mg Oral Daily   Continuous Infusions: . sodium chloride       LOS: 3 days   Time spent:> 35 minutes  Penny Pia, MD [facility] Pager [phone]  If 7PM-7AM, please contact night-coverage [email or web address] Password [facility] 12/14/2016, 3:45 PM
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
## 2016-12-14 NOTE — Clinical Social Work Note (Signed)
Clinical Social Work Assessment  Patient Details  Name: Alexis Floyd MRN: [id number] Date of Birth: 1965-07-13  Date of referral:  12/14/16               Reason for consult:  Facility Placement                Permission sought to share information with:  Chartered certified accountant granted to share information::  No  Name::        Agency::     Relationship::     Contact Information:     Housing/Transportation Living arrangements for the past 2 months:  Single Family Home Source of Information:   (Pt's family) Patient Interpreter Needed:    Criminal Activity/Legal Involvement Pertinent to Current Situation/Hospitalization:    Significant Relationships:   (CSW received a call from pt's RN forwarding a request from pt's family for assistance with placement for SNF.) Lives with:   (CSW received a call from pt's RN forwarding a request from pt's family for assistance with placement for SNF.) Do you feel safe going back to the place where you live?  No Need for family participation in patient care:  Yes (Comment)  Care giving concerns:  Family concerned their kitchen floor needs repair/is being repaired and pt cannot navigate in a wheelchair   Social Worker assessment / plan:  CSW met with pt and confirmed pt's plan to be discharged to home to live at discharge.  CSW provided active listening and validated pt's concerns.  Pt has been living with family prior to being admitted to [facility].   Employment status:  Disabled (Comment on whether or not currently receiving Disability) Insurance information:  Medicaid In [place] PT Recommendations:  Home with [place] / Referral to community resources:     Patient/Family's Response to care:  Patient not fully alert and oriented.  Patient and pt's familk agreeable to plan.  Pt's sister and BIL supportive and strongly involved in pt.'s care.  Pt and pt's pleasant and appreciated CSW intervention.   Patient/Family's  Understanding of and Emotional Response to Diagnosis, Current Treatment, and Prognosis: Still assessing  Emotional Assessment Appearance:  Appears older than stated age Attitude/Demeanor/Rapport:  Unable to Assess Affect (typically observed):  Unable to Assess Orientation:  Fluctuating Orientation (Suspected and/or reported Sundowners) Alcohol / Substance use:    Psych involvement (Current and /or in the community):     Discharge Needs  Concerns to be addressed:  Home Safety Concerns Readmission within the last 30 days:  No Current discharge risk:  None Barriers to Discharge:  No Barriers Identified   Claudine Mouton, LCSWA 12/14/2016, 4:28 PM
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
## 2016-12-15 ENCOUNTER — Encounter (HOSPITAL_COMMUNITY): Payer: Self-pay | Admitting: Orthopedic Surgery

## 2016-12-15 LAB — CBC
HEMATOCRIT: 28 % — AB (ref 36.0–46.0)
HEMOGLOBIN: 9.7 g/dL — AB (ref 12.0–15.0)
MCH: 32.2 pg (ref 26.0–34.0)
MCHC: 34.6 g/dL (ref 30.0–36.0)
MCV: 93 fL (ref 78.0–100.0)
Platelets: 66 10*3/uL — ABNORMAL LOW (ref 150–400)
RBC: 3.01 MIL/uL — ABNORMAL LOW (ref 3.87–5.11)
RDW: 14.4 % (ref 11.5–15.5)
WBC: 3.7 10*3/uL — ABNORMAL LOW (ref 4.0–10.5)

## 2016-12-15 LAB — BASIC METABOLIC PANEL
Anion gap: 6 (ref 5–15)
BUN: 6 mg/dL (ref 6–20)
CHLORIDE: 105 mmol/L (ref 101–111)
CO2: 25 mmol/L (ref 22–32)
CREATININE: 0.43 mg/dL — AB (ref 0.44–1.00)
Calcium: 8.3 mg/dL — ABNORMAL LOW (ref 8.9–10.3)
GFR calc non Af Amer: >60 mL/min (ref >60)
GLUCOSE: 80 mg/dL (ref 65–99)
Potassium: 3.2 mmol/L — ABNORMAL LOW (ref 3.5–5.1)
Sodium: 136 mmol/L (ref 135–145)

## 2016-12-15 LAB — GLUCOSE, CAPILLARY
GLUCOSE-CAPILLARY: 95 mg/dL (ref 65–99)
GLUCOSE-CAPILLARY: 237 mg/dL — AB (ref 65–99)
GLUCOSE-CAPILLARY: 267 mg/dL — AB (ref 65–99)
Glucose-Capillary: 160 mg/dL — ABNORMAL HIGH (ref 65–99)

## 2016-12-15 MED ORDER — HYDROCODONE-ACETAMINOPHEN 5-325 MG PO TABS: 1.0000 | ORAL_TABLET | Freq: Four times a day (QID) | ORAL | 0 refills | Status: DC | PRN

## 2016-12-15 MED ORDER — ENOXAPARIN SODIUM 40 MG/0.4ML ~~LOC~~ SOLN: 40.0000 mg | SUBCUTANEOUS | 0 refills | Status: DC

## 2016-12-15 NOTE — Discharge Summary (Signed)
Physician Discharge Summary  Alexis Floyd TDH:[id number] DOB: July 09, 1965 DOA: 12/11/2016  PCP: Abner Greenspan, MD  Admit date: 12/11/2016 Discharge date: 12/15/2016  Time spent: > 35 minutes  Recommendations for Outpatient Follow-up:  1. Please ensure patient follows up with orthopedic surgeon Dr. Linna Caprice 2. Reassess K levels and replace if necessary   Discharge Diagnoses:  Principal Problem:   Closed left hip fracture, initial encounter [facility]) Active Problems:   Other cirrhosis of liver (HCC)   Thrombocytopenia (HCC)   Hyponatremia   Diabetes mellitus, type II, insulin dependent (HCC)   Chronic migraine   Discharge Condition: stable  Diet recommendation: Carb modified diet  Filed Weights   12/11/16 1948  Weight: 59.4 kg (131 lb)    History of present illness:  51 y.o.femalewith medical history significant for alcoholism in remission, insulin-dependent diabetes mellitus, chronic migraines, liver cirrhosis, and chronic thrombocytopenia, now presenting to the emergency department with severe left hip pain after a mechanical fall. Patient reports that she was in her usual state of health when she tripped over something and fell onto her left side, experiencing severe pain at the left hip.  Hospital Course:   Principal Problem:   Closed left hip fracture, initial encounter [facility]) - Orthopedic surgery on board while patient in house and Pt is s/p operation IMN L hip - Continue supportive therapy  Active Problems:   Other cirrhosis of liver (HCC) - Stable AST and ALT were within normal limits    Thrombocytopenia (HCC) - Most likely secondary to liver disease    Hyponatremia - resolved    Diabetes mellitus, type II, insulin dependent (HCC) - Continue diabetic diet at facility and continue prior to admission insulin regimen adjusted as needed.    Chronic migraine - stable no HA reported.  Procedures:  IMN  Consultations:  Ortho: Dr. Linna Caprice  Discharge  Exam: Vitals:   12/14/16 2100 12/15/16 0300  BP: 127/74 (!) 152/129  Pulse: (!) 127 (!) 122  Resp: 20 20  Temp: 99.5 F (37.5 C) 99.6 F (37.6 C)  SpO2: 100% 97%    General: Pt in nad, alert and awake Cardiovascular: rrr, no rubs Respiratory: no increased wob, no wheezes  Discharge Instructions   Discharge Instructions    Call MD for:  severe uncontrolled pain    Complete by:  As directed    Call MD for:  temperature >100.4    Complete by:  As directed    Diet - low sodium heart healthy    Complete by:  As directed    Increase activity slowly    Complete by:  As directed      Current Discharge Medication List    START taking these medications   Details  enoxaparin (LOVENOX) 40 MG/0.4ML injection Inject 0.4 mLs (40 mg total) into the skin daily. Qty: 30 Syringe, Refills: 0    HYDROcodone-acetaminophen (NORCO/VICODIN) 5-325 MG tablet Take 1-2 tablets by mouth every 6 (six) hours as needed for moderate pain. Qty: 60 tablet, Refills: 0      CONTINUE these medications which have NOT CHANGED   Details  amitriptyline (ELAVIL) 100 MG tablet Take 100 mg by mouth at bedtime.     Ascorbic Acid (VITAMIN C) 1000 MG tablet Take 1,000 mg by mouth daily.     gabapentin (NEURONTIN) 600 MG tablet Take 600 mg by mouth 3 (three) times daily.     Insulin Detemir (LEVEMIR FLEXTOUCH) 100 UNIT/ML Pen Inject 24-28 Units into the skin See admin instructions. Inject 24 units  SQ daily for 1 week, then inject 26 units SQ daily for 1 week, then inject 28 units SQ daily thereafter    valACYclovir (VALTREX) 1000 MG tablet Take 1,000 mg by mouth 2 (two) times daily.    varenicline (CHANTIX STARTING MONTH PAK) 0.5 MG X 11 & 1 MG X 42 tablet Take 0.5-1 mg by mouth See admin instructions. Take one 0.5 mg tablet by mouth once daily for 3 days, then increase to one 0.5 mg tablet twice daily for 4 days, then increase to one 1 mg tablet twice daily.      STOP taking these medications     ibuprofen  (ADVIL,MOTRIN) 800 MG tablet        No Known Allergies  Contact information for follow-up providers    Swinteck, Arlys John, MD. Schedule an appointment as soon as possible for a visit in 2 weeks.   Specialty:  Orthopedic Surgery Why:  For suture removal, For wound re-check Contact information: [address] [place] [address] [phone]            Contact information for after-discharge care    Destination    [facility] .   Specialty:  Skilled Nursing Facility Contact information: [address] [place] [address] [phone]                   The results of significant diagnostics from this hospitalization (including imaging, microbiology, ancillary and laboratory) are listed below for reference.    Significant Diagnostic Studies: Dg Chest 1 View  Result Date: 12/11/2016 CLINICAL DATA:  Preoperative exam EXAM: CHEST 1 VIEW COMPARISON:  None. FINDINGS: The heart size and mediastinal contours are within normal limits. Both lungs are clear. The visualized skeletal structures are unremarkable. IMPRESSION: No active disease. Electronically Signed   By: Deatra Robinson M.D.   On: 12/11/2016 22:01   Dg C-arm 1-60 Min  Result Date: 12/13/2016 CLINICAL DATA:  Left hip intertrochanteric fracture EXAM: DG C-ARM 61-120 MIN; LEFT FEMUR 2 VIEWS CONTRAST:  None. FLUOROSCOPY TIME:  Fluoroscopy Time:  1 minutes 11 seconds COMPARISON:  12/11/2016 FINDINGS: Four spot fluoroscopic intraoperative views demonstrate left femur intramedullary rod and screw fixation to reduce the left femur intertrochanteric fracture. Anatomic alignment. No complicating feature. IMPRESSION: Improved alignment following ORIF of the left hip intertrochanteric fracture Electronically Signed   By: Judie Petit.  Shick M.D.   On: 12/13/2016 09:04   Dg Hip Unilat With Pelvis 1v Left  Result Date: 12/12/2016 CLINICAL DATA:  Left femoral fracture EXAM: DG HIP (WITH OR  WITHOUT PELVIS) 1V*L* COMPARISON:  None. FINDINGS: Comminuted left intertrochanteric fracture with mild angulation and displacement. No hip dislocation. No aggressive osseous lesion. IMPRESSION: Comminuted left intertrochanteric fracture with mild angulation and displacement. Electronically Signed   By: Elige Ko   On: 12/12/2016 15:04   Dg Hip Unilat W Or Wo Pelvis 2-3 Views Left  Result Date: 12/11/2016 CLINICAL DATA:  51 year old female with history of trauma from a fall complaining of pain in the left hip and left leg. EXAM: DG HIP (WITH OR WITHOUT PELVIS) 2-3V LEFT COMPARISON:  No priors. FINDINGS: Two nonstandard views are submitted for evaluation, which demonstrates what appears to be an impacted subtrochanteric fracture of the left hip with some varus angulation. There is also approximately 9 mm of medial displacement. Left femoral head appears to be located on this limited examination. Visualized portions of the bony pelvis and right proximal femur also appear intact. IMPRESSION:  1. Impacted angulated probable subtrochanteric fracture of the left hip, as discussed above. The possibility of an intertrochanteric component of the fracture is not excluded. Electronically Signed   By: Trudie Reed M.D.   On: 12/11/2016 20:41   Dg Femur Min 2 Views Left  Result Date: 12/13/2016 CLINICAL DATA:  Left hip intertrochanteric fracture EXAM: DG C-ARM 61-120 MIN; LEFT FEMUR 2 VIEWS CONTRAST:  None. FLUOROSCOPY TIME:  Fluoroscopy Time:  1 minutes 11 seconds COMPARISON:  12/11/2016 FINDINGS: Four spot fluoroscopic intraoperative views demonstrate left femur intramedullary rod and screw fixation to reduce the left femur intertrochanteric fracture. Anatomic alignment. No complicating feature. IMPRESSION: Improved alignment following ORIF of the left hip intertrochanteric fracture Electronically Signed   By: Judie Petit.  Shick M.D.   On: 12/13/2016 09:04   Dg Femur Port Min 2 Views Left  Result Date:  12/13/2016 CLINICAL DATA:  Status post placement of intramedullary nail. EXAM: LEFT FEMUR PORTABLE 2 VIEWS COMPARISON:  December 13, 2016 FINDINGS: A gamma nail and intramedullary rod have been placed across the left hip fracture. A distal interlocking screw is identified. Hardware is in good position. Soft tissue gas is consistent with postsurgical change. No other acute abnormalities. No dislocation. IMPRESSION: Surgical hardware placement as above. No acute abnormalities otherwise seen. Electronically Signed   By: Gerome Sam III M.D   On: 12/13/2016 10:49    Microbiology: Recent Results (from the past 240 hour(s))  Surgical pcr screen     Status: Abnormal   Collection Time: 12/12/16 10:31 PM  Result Value Ref Range Status   MRSA, PCR NEGATIVE NEGATIVE Final   Staphylococcus aureus POSITIVE (A) NEGATIVE Final    Comment:        The Xpert SA Assay (FDA approved for NASAL specimens in patients over 40 years of age), is one component of a comprehensive surveillance program.  Test performance has been validated by [facility] for patients greater than or equal to 58 year old. It is not intended to diagnose infection nor to guide or monitor treatment.      Labs: Basic Metabolic Panel:  Recent Labs Lab 12/11/16 2102 12/12/16 0036 12/14/16 0509 12/15/16 0448  NA 128* 134* 135 136  K 3.7 3.7 3.8 3.2*  CL 98* 101 103 105  CO2 22 23 25 25   GLUCOSE 405* 314* 228* 80  BUN 6 5* <5* 6  CREATININE 0.51 0.43* 0.40* 0.43*  CALCIUM 8.9 8.9 8.0* 8.3*   Liver Function Tests:  Recent Labs Lab 12/12/16 0036  AST 41  ALT 36  ALKPHOS 171*  BILITOT 1.7*  PROT 7.3  ALBUMIN 3.3*   No results for input(s): LIPASE, AMYLASE in the last 168 hours. No results for input(s): AMMONIA in the last 168 hours. CBC:  Recent Labs Lab 12/11/16 2102 12/12/16 0036 12/14/16 0509 12/15/16 0448  WBC 4.2 5.6 3.6* 3.7*  NEUTROABS 3.1  --   --   --   HGB 13.5 13.4 9.9* 9.7*  HCT 36.8 36.8  28.1* 28.0*  MCV 92.7 91.1 94.3 93.0  PLT 51* 61* 49* 66*   Cardiac Enzymes: No results for input(s): CKTOTAL, CKMB, CKMBINDEX, TROPONINI in the last 168 hours. BNP: BNP (last 3 results) No results for input(s): BNP in the last 8760 hours.  ProBNP (last 3 results) No results for input(s): PROBNP in the last 8760 hours.  CBG:  Recent Labs Lab 12/14/16 1707 12/14/16 2119 12/15/16 0030 12/15/16 0622 12/15/16 1224  GLUCAP 338* 203* 160* 95 237*   Signed:  Penny Pia MD.  [facility] 12/15/2016, 2:53 PM

## 2016-12-15 NOTE — Clinical Social Work Placement (Signed)
   CLINICAL SOCIAL WORK PLACEMENT  NOTE  Date:  12/15/2016  Patient Details  Name: Alexis Floyd MRN: [id number] Date of Birth: 1965/12/23  Clinical Social Work is seeking post-discharge placement for this patient at the Skilled  Nursing Facility level of care (*CSW will initial, date and re-position this form in  chart as items are completed):  Yes   Patient/family provided with [place] Clinical Social Work Department's list of facilities offering this level of care within the geographic area requested by the patient (or if unable, by the patient's family).  Yes   Patient/family informed of their freedom to choose among providers that offer the needed level of care, that participate in Medicare, Medicaid or managed care program needed by the patient, have an available bed and are willing to accept the patient.  Yes   Patient/family informed of [facility]'s ownership interest in [facility] and [facility], as well as of the fact that they are under no obligation to receive care at these facilities.  PASRR submitted to EDS on       PASRR number received on 12/15/16     Existing PASRR number confirmed on       FL2 transmitted to all facilities in geographic area requested by pt/family on 12/15/16     FL2 transmitted to all facilities within larger geographic area on       Patient informed that his/her managed care company has contracts with or will negotiate with certain facilities, including the following:        Yes   Patient/family informed of bed offers received.  Patient chooses bed at [facility]     Physician recommends and patient chooses bed at      Patient to be transferred to [facility] on 12/15/16.  Patient to be transferred to facility by [facility]     Patient family notified on 12/15/16 of transfer.  Name of family member notified:  patient aware as well as mom     PHYSICIAN Please prepare priority discharge summary,  including medications, Please prepare prescriptions, Please sign FL2     Additional Comment:    _______________________________________________ Tresa Moore, LCSW 12/15/2016, 3:18 PM

## 2016-12-15 NOTE — Progress Notes (Signed)
   Subjective:  Patient reports pain as mild to moderate.    Objective:   VITALS:   Vitals:   12/14/16 0300 12/14/16 1059 12/14/16 2100 12/15/16 0300  BP: 121/72 119/72 127/74 (!) 152/129  Pulse: (!) 116 (!) 111 (!) 127 (!) 122  Resp: 18 18 20 20   Temp: (!) 100.4 F (38 C) 99 F (37.2 C) 99.5 F (37.5 C) 99.6 F (37.6 C)  TempSrc: Oral Oral Oral Oral  SpO2: 93% 100% 100% 97%  Weight:      Height:       NAD ABD soft Intact pulses distally Incision: dressing C/D/I Compartment soft Weak DF/great toe extension - baseline  Lab Results  Component Value Date   WBC 3.7 (L) 12/15/2016   HGB 9.7 (L) 12/15/2016   HCT 28.0 (L) 12/15/2016   MCV 93.0 12/15/2016   PLT 66 (L) 12/15/2016   BMET    Component Value Date/Time   NA 136 12/15/2016 0448   K 3.2 (L) 12/15/2016 0448   CL 105 12/15/2016 0448   CO2 25 12/15/2016 0448   GLUCOSE 80 12/15/2016 0448   BUN 6 12/15/2016 0448   CREATININE 0.43 (L) 12/15/2016 0448   CALCIUM 8.3 (L) 12/15/2016 0448   GFRNONAA >60 12/15/2016 0448   GFRAA >60 12/15/2016 0448     Assessment/Plan: 2 Days Post-Op   Principal Problem:   Closed left hip fracture, initial encounter (HCC) Active Problems:   Other cirrhosis of liver (HCC)   Thrombocytopenia (HCC)   Hyponatremia   Diabetes mellitus, type II, insulin dependent (HCC)   Chronic migraine   TDWB LLE DVT ppx: lovenox x30 days PO pain control PT/OT D/C to SNF, Rx printed   Alexis Floyd, Cloyde Reams 12/15/2016, 1:35 PM   Samson Frederic, MD Cell [phone]

## 2016-12-15 NOTE — Progress Notes (Addendum)
RN obtaining vitals for pt prior to d/c. HR was 122 with pt at rest (reviewing vitals flowsheet, HR has been 100-120). Other VSS, pt is asymptomatic other that stating "I can feel my heart going fast." Dr. Cena Benton notified and received order for EKG.   [place], Latricia Heft
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
## 2016-12-15 NOTE — [place] FL2 (Signed)
[place] MEDICAID FL2 LEVEL OF CARE SCREENING TOOL     IDENTIFICATION  Patient Name: Alexis Floyd Birthdate: 27-Mar-1966 Sex: female Admission Date (Current Location): 12/11/2016  [facility] and [place] Number:  Producer, television/film/video and Address:  [facility] [place], [address] [place], [place] [address]      Provider Number: [id number]  Attending Physician Name and Address:  Penny Pia, MD  Relative Name and Phone Number:  Arlee Muslim [phone]    Current Level of Care: Hospital Recommended Level of Care: Skilled Nursing Facility Prior Approval Number:    Date Approved/Denied: 12/15/16 PASRR Number: [id number]  Discharge Plan: SNF    Current Diagnoses: Patient Active Problem List   Diagnosis Date Noted  . Other cirrhosis of liver (HCC) 12/11/2016  . Thrombocytopenia (HCC) 12/11/2016  . Hyponatremia 12/11/2016  . Closed left hip fracture, initial encounter (HCC) 12/11/2016  . Diabetes mellitus, type II, insulin dependent (HCC) 12/11/2016  . Chronic migraine 12/11/2016  . SOB (shortness of breath)     Orientation RESPIRATION BLADDER Height & Weight     Self, Time, Situation, Place  Normal Continent Weight: 131 lb (59.4 kg) Height:  5\' 6"  (167.6 cm)  BEHAVIORAL SYMPTOMS/MOOD NEUROLOGICAL BOWEL NUTRITION STATUS      Continent Diet (See DC Summary)  AMBULATORY STATUS COMMUNICATION OF NEEDS Skin   Limited Assist Verbally (speech impairment) Surgical wounds (Left Leg Closed Incision Hydrocolloid)                       Personal Care Assistance Level of Assistance  Bathing, Feeding, Dressing Bathing Assistance: Maximum assistance Feeding assistance: Limited assistance Dressing Assistance: Maximum assistance     Functional Limitations Info  Speech     Speech Info: Impaired    SPECIAL CARE FACTORS FREQUENCY  PT (By licensed PT), OT (By licensed OT)     PT Frequency: 6xweek OT Frequency: 3x week             Contractures      Additional Factors Info  Code Status, Allergies Code Status Info: Full Code Allergies Info: No Known Allergies           Current Medications (12/15/2016):  This is the current hospital active medication list Current Facility-Administered Medications  Medication Dose Route Frequency Provider Last Rate Last Dose  . acetaminophen (TYLENOL) tablet 650 mg  650 mg Oral Q6H PRN Samson Frederic, MD   650 mg at 12/14/16 1332   Or  . acetaminophen (TYLENOL) suppository 650 mg  650 mg Rectal Q6H PRN Swinteck, Arlys John, MD      . amitriptyline (ELAVIL) tablet 100 mg  100 mg Oral QHS Opyd, Lavone Neri, MD   100 mg at 12/14/16 2105  . bisacodyl (DULCOLAX) EC tablet 5 mg  5 mg Oral Daily PRN Opyd, Lavone Neri, MD   5 mg at 12/15/16 0857  . enoxaparin (LOVENOX) injection 40 mg  40 mg Subcutaneous Q24H Samson Frederic, MD   40 mg at 12/15/16 0851  . feeding supplement (GLUCERNA SHAKE) (GLUCERNA SHAKE) liquid 237 mL  237 mL Oral BID BM Penny Pia, MD   237 mL at 12/15/16 0851  . gabapentin (NEURONTIN) capsule 100 mg  100 mg Oral Daily Opyd, Lavone Neri, MD   100 mg at 12/15/16 0852  . HYDROcodone-acetaminophen (NORCO/VICODIN) 5-325 MG per tablet 1-2 tablet  1-2 tablet Oral Q6H PRN Opyd, Lavone Neri, MD   2 tablet at 12/15/16 0851  . insulin aspart (  novoLOG) injection 0-15 Units  0-15 Units Subcutaneous Q4H Opyd, Lavone Neri, MD   3 Units at 12/15/16 0141  . insulin detemir (LEVEMIR) injection 20 Units  20 Units Subcutaneous QHS Penny Pia, MD   20 Units at 12/14/16 2200  . menthol-cetylpyridinium (CEPACOL) lozenge 3 mg  1 lozenge Oral PRN Swinteck, Arlys John, MD       Or  . phenol (CHLORASEPTIC) mouth spray 1 spray  1 spray Mouth/Throat PRN Swinteck, Arlys John, MD      . metoCLOPramide (REGLAN) tablet 5-10 mg  5-10 mg Oral Q8H PRN Swinteck, Arlys John, MD       Or  . metoCLOPramide (REGLAN) injection 5-10 mg  5-10 mg Intravenous Q8H PRN Swinteck, Arlys John, MD      . morphine 4 MG/ML injection 0.52 mg   0.52 mg Intravenous Q2H PRN Opyd, Lavone Neri, MD   0.52 mg at 12/14/16 1912  . morphine 4 MG/ML injection 2 mg  2 mg Intravenous Q2H PRN Yolonda Kida, MD   2 mg at 12/15/16 1610  . mupirocin ointment (BACTROBAN) 2 % 1 application  1 application Nasal BID Jinger Neighbors, NP   1 application at 12/15/16 [phone]  . ondansetron (ZOFRAN) injection 4 mg  4 mg Intravenous Q6H PRN Opyd, Lavone Neri, MD      . senna-docusate (Senokot-S) tablet 1 tablet  1 tablet Oral QHS PRN Opyd, Lavone Neri, MD      . sodium chloride 0.9 % bolus 500 mL  500 mL Intravenous Once Opyd, Lavone Neri, MD      . vitamin C (ASCORBIC ACID) tablet 1,000 mg  1,000 mg Oral Daily Opyd, Lavone Neri, MD   1,000 mg at 12/15/16 5409     Discharge Medications: Please see discharge summary for a list of discharge medications.  Relevant Imaging Results:  Relevant Lab Results:   Additional Information Ss#: [phone]  Tresa Moore, LCSW

## 2016-12-15 NOTE — Social Work (Addendum)
Clinical Social Worker facilitated patient discharge including contacting patient family and facility to confirm patient discharge plans.  Clinical information faxed to facility and family agreeable with plan.    CSW arranged ambulance transport via [facility] to [facility].  Pt going to Room 615B.  RN to call [phone] to give report prior to discharge.  Clinical Social Worker will sign off for now as social work intervention is no longer needed. Please consult [place] again if new need arises.  Keene Breath, LCSW Clinical Social Worker [phone]
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
## 2016-12-15 NOTE — Progress Notes (Signed)
Pt ready for d/c to SNF per MD. Report called to Ludie at [facility]. Belongings gathered and pt's mother is aware of plan. Pt will be transported to facility via [facility].   Corn, Latricia Heft

## 2016-12-15 NOTE — Social Work (Signed)
CSW spoke with patient to confirm SNF placement. Patient on board to go to SNF on [place] due to inability of mom to assist with new impairment.  CSW then called mom to confirm and mom indicated that she cannot care for her well with impairment. CSW explained possible limitations with selections with insurance. CSW will complete FL2, passr, and send offer to [place] area.  Keene Breath, LCSW Clinical Social Worker [phone]

## 2016-12-16 LAB — CALCITRIOL (1,25 DI-OH VIT D): Vit D, 1,25-Dihydroxy: 59.2 pg/mL (ref 19.9–79.3)

## 2017-07-02 DIAGNOSIS — D6959 Other secondary thrombocytopenia: Secondary | ICD-10-CM | POA: Diagnosis not present

## 2017-07-02 DIAGNOSIS — R161 Splenomegaly, not elsewhere classified: Secondary | ICD-10-CM | POA: Diagnosis not present

## 2017-07-02 DIAGNOSIS — D72819 Decreased white blood cell count, unspecified: Secondary | ICD-10-CM | POA: Diagnosis not present

## 2017-07-02 DIAGNOSIS — K7460 Unspecified cirrhosis of liver: Secondary | ICD-10-CM | POA: Diagnosis not present

## 2018-07-02 DIAGNOSIS — D6959 Other secondary thrombocytopenia: Secondary | ICD-10-CM

## 2018-07-02 DIAGNOSIS — R161 Splenomegaly, not elsewhere classified: Secondary | ICD-10-CM

## 2018-07-02 DIAGNOSIS — D72819 Decreased white blood cell count, unspecified: Secondary | ICD-10-CM | POA: Diagnosis not present

## 2018-07-02 DIAGNOSIS — K7460 Unspecified cirrhosis of liver: Secondary | ICD-10-CM

## 2018-09-29 DIAGNOSIS — E871 Hypo-osmolality and hyponatremia: Secondary | ICD-10-CM

## 2018-09-29 DIAGNOSIS — R0989 Other specified symptoms and signs involving the circulatory and respiratory systems: Secondary | ICD-10-CM

## 2018-09-29 DIAGNOSIS — K7290 Hepatic failure, unspecified without coma: Secondary | ICD-10-CM

## 2018-09-29 DIAGNOSIS — E119 Type 2 diabetes mellitus without complications: Secondary | ICD-10-CM

## 2018-09-29 DIAGNOSIS — K7460 Unspecified cirrhosis of liver: Secondary | ICD-10-CM

## 2018-09-29 DIAGNOSIS — E876 Hypokalemia: Secondary | ICD-10-CM

## 2018-09-29 DIAGNOSIS — R140 Abdominal distension (gaseous): Secondary | ICD-10-CM

## 2018-09-29 DIAGNOSIS — R188 Other ascites: Secondary | ICD-10-CM

## 2018-09-29 DIAGNOSIS — D61818 Other pancytopenia: Secondary | ICD-10-CM

## 2018-09-30 DIAGNOSIS — D61818 Other pancytopenia: Secondary | ICD-10-CM | POA: Diagnosis not present

## 2018-09-30 DIAGNOSIS — R140 Abdominal distension (gaseous): Secondary | ICD-10-CM | POA: Diagnosis not present

## 2018-09-30 DIAGNOSIS — R188 Other ascites: Secondary | ICD-10-CM | POA: Diagnosis not present

## 2018-09-30 DIAGNOSIS — K7460 Unspecified cirrhosis of liver: Secondary | ICD-10-CM | POA: Diagnosis not present

## 2018-10-01 DIAGNOSIS — R140 Abdominal distension (gaseous): Secondary | ICD-10-CM | POA: Diagnosis not present

## 2018-10-01 DIAGNOSIS — K7460 Unspecified cirrhosis of liver: Secondary | ICD-10-CM | POA: Diagnosis not present

## 2018-10-01 DIAGNOSIS — R188 Other ascites: Secondary | ICD-10-CM | POA: Diagnosis not present

## 2018-10-01 DIAGNOSIS — D61818 Other pancytopenia: Secondary | ICD-10-CM | POA: Diagnosis not present

## 2018-10-02 DIAGNOSIS — D61818 Other pancytopenia: Secondary | ICD-10-CM | POA: Diagnosis not present

## 2018-10-02 DIAGNOSIS — R140 Abdominal distension (gaseous): Secondary | ICD-10-CM | POA: Diagnosis not present

## 2018-10-02 DIAGNOSIS — R188 Other ascites: Secondary | ICD-10-CM | POA: Diagnosis not present

## 2018-10-02 DIAGNOSIS — K7460 Unspecified cirrhosis of liver: Secondary | ICD-10-CM | POA: Diagnosis not present

## 2018-10-13 DIAGNOSIS — R0902 Hypoxemia: Secondary | ICD-10-CM

## 2018-10-13 DIAGNOSIS — R188 Other ascites: Secondary | ICD-10-CM

## 2018-10-13 DIAGNOSIS — E876 Hypokalemia: Secondary | ICD-10-CM | POA: Diagnosis not present

## 2018-10-13 DIAGNOSIS — B1920 Unspecified viral hepatitis C without hepatic coma: Secondary | ICD-10-CM | POA: Diagnosis not present

## 2018-10-13 DIAGNOSIS — E1165 Type 2 diabetes mellitus with hyperglycemia: Secondary | ICD-10-CM

## 2018-10-13 DIAGNOSIS — K7290 Hepatic failure, unspecified without coma: Secondary | ICD-10-CM

## 2018-10-13 DIAGNOSIS — K7460 Unspecified cirrhosis of liver: Secondary | ICD-10-CM

## 2018-10-14 DIAGNOSIS — E876 Hypokalemia: Secondary | ICD-10-CM | POA: Diagnosis not present

## 2018-10-14 DIAGNOSIS — B1920 Unspecified viral hepatitis C without hepatic coma: Secondary | ICD-10-CM | POA: Diagnosis not present

## 2018-10-14 DIAGNOSIS — R188 Other ascites: Secondary | ICD-10-CM | POA: Diagnosis not present

## 2018-10-14 DIAGNOSIS — R0902 Hypoxemia: Secondary | ICD-10-CM | POA: Diagnosis not present

## 2018-10-15 DIAGNOSIS — E876 Hypokalemia: Secondary | ICD-10-CM | POA: Diagnosis not present

## 2018-10-15 DIAGNOSIS — B1920 Unspecified viral hepatitis C without hepatic coma: Secondary | ICD-10-CM | POA: Diagnosis not present

## 2018-10-15 DIAGNOSIS — Z8669 Personal history of other diseases of the nervous system and sense organs: Secondary | ICD-10-CM

## 2018-10-15 DIAGNOSIS — B182 Chronic viral hepatitis C: Secondary | ICD-10-CM

## 2018-10-15 DIAGNOSIS — Z87820 Personal history of traumatic brain injury: Secondary | ICD-10-CM

## 2018-10-15 DIAGNOSIS — R188 Other ascites: Secondary | ICD-10-CM | POA: Diagnosis not present

## 2018-10-15 DIAGNOSIS — E119 Type 2 diabetes mellitus without complications: Secondary | ICD-10-CM | POA: Diagnosis not present

## 2018-11-09 ENCOUNTER — Encounter: Payer: Self-pay | Admitting: Gastroenterology

## 2018-11-09 DIAGNOSIS — B182 Chronic viral hepatitis C: Secondary | ICD-10-CM | POA: Insufficient documentation

## 2018-11-09 DIAGNOSIS — R188 Other ascites: Secondary | ICD-10-CM | POA: Insufficient documentation

## 2018-12-09 ENCOUNTER — Encounter: Payer: Self-pay | Admitting: Gastroenterology

## 2018-12-09 ENCOUNTER — Other Ambulatory Visit (INDEPENDENT_AMBULATORY_CARE_PROVIDER_SITE_OTHER): Payer: Medicaid Other

## 2018-12-09 ENCOUNTER — Ambulatory Visit (INDEPENDENT_AMBULATORY_CARE_PROVIDER_SITE_OTHER): Payer: Medicaid Other | Admitting: Gastroenterology

## 2018-12-09 VITALS — BP 110/70 | HR 112 | Temp 98.0°F | Ht 66.0 in | Wt 117.2 lb

## 2018-12-09 DIAGNOSIS — R188 Other ascites: Secondary | ICD-10-CM

## 2018-12-09 DIAGNOSIS — E639 Nutritional deficiency, unspecified: Secondary | ICD-10-CM

## 2018-12-09 DIAGNOSIS — K7682 Hepatic encephalopathy: Secondary | ICD-10-CM

## 2018-12-09 DIAGNOSIS — K7031 Alcoholic cirrhosis of liver with ascites: Secondary | ICD-10-CM

## 2018-12-09 DIAGNOSIS — B182 Chronic viral hepatitis C: Secondary | ICD-10-CM

## 2018-12-09 DIAGNOSIS — R140 Abdominal distension (gaseous): Secondary | ICD-10-CM

## 2018-12-09 DIAGNOSIS — K7290 Hepatic failure, unspecified without coma: Secondary | ICD-10-CM

## 2018-12-09 DIAGNOSIS — K7460 Unspecified cirrhosis of liver: Secondary | ICD-10-CM

## 2018-12-09 DIAGNOSIS — R54 Age-related physical debility: Secondary | ICD-10-CM

## 2018-12-09 LAB — IBC + FERRITIN
Ferritin: 48.2 ng/mL (ref 10.0–291.0)
Iron: 76 ug/dL (ref 42–145)
Saturation Ratios: 30.5 % (ref 20.0–50.0)
Transferrin: 178 mg/dL — ABNORMAL LOW (ref 212.0–360.0)

## 2018-12-09 LAB — COMPREHENSIVE METABOLIC PANEL
ALT: 32 U/L (ref 0–35)
AST: 31 U/L (ref 0–37)
Albumin: 2.6 g/dL — ABNORMAL LOW (ref 3.5–5.2)
Alkaline Phosphatase: 146 U/L — ABNORMAL HIGH (ref 39–117)
BUN: 14 mg/dL (ref 6–23)
CO2: 26 mEq/L (ref 19–32)
Calcium: 8.4 mg/dL (ref 8.4–10.5)
Chloride: 101 mEq/L (ref 96–112)
Creatinine, Ser: 0.47 mg/dL (ref 0.40–1.20)
GFR: 139.62 mL/min (ref >60.00)
Glucose, Bld: 143 mg/dL — ABNORMAL HIGH (ref 70–99)
Potassium: 3.1 mEq/L — ABNORMAL LOW (ref 3.5–5.1)
Sodium: 137 mEq/L (ref 135–145)
Total Bilirubin: 1.9 mg/dL — ABNORMAL HIGH (ref 0.2–1.2)
Total Protein: 7.1 g/dL (ref 6.0–8.3)

## 2018-12-09 LAB — CBC
HCT: 37.2 % (ref 36.0–46.0)
Hemoglobin: 12.6 g/dL (ref 12.0–15.0)
MCHC: 33.7 g/dL (ref 30.0–36.0)
MCV: 102.5 fl — ABNORMAL HIGH (ref 78.0–100.0)
Platelets: 64 10*3/uL — ABNORMAL LOW (ref 150.0–400.0)
RBC: 3.63 Mil/uL — ABNORMAL LOW (ref 3.87–5.11)
RDW: 18.5 % — ABNORMAL HIGH (ref 11.5–15.5)
WBC: 3.8 10*3/uL — ABNORMAL LOW (ref 4.0–10.5)

## 2018-12-09 LAB — PROTIME-INR
INR: 1.5 ratio — ABNORMAL HIGH (ref 0.8–1.0)
Prothrombin Time: 17.5 s — ABNORMAL HIGH (ref 9.6–13.1)

## 2018-12-09 MED ORDER — LACTULOSE 10 GM/15ML PO SOLN: 10.0000 g | Freq: Three times a day (TID) | ORAL | 5 refills | Status: AC

## 2018-12-09 NOTE — Patient Instructions (Signed)
You have been scheduled for an abdominal ultrasound at [facility] (1st floor of hospital) on 12/30/2018 at 9:30am. Please arrive 15 minutes prior to your appointment for registration. Make certain not to have anything to eat or drink 6 hours prior to your appointment. Should you need to reschedule your appointment, please contact radiology at [phone]. This test typically takes about 30 minutes to perform.  Start [employer] 2-3 times daily.   We have sent the following medications to your pharmacy for you to pick up at your convenience: Lactulose   Your provider has requested that you go to the basement level for lab work before leaving today. Press "B" on the elevator. The lab is located at the first door on the left as you exit the elevator.  Thank you for choosing me and [facility].  Dr. Rush Landmark

## 2018-12-12 ENCOUNTER — Encounter: Payer: Self-pay | Admitting: Gastroenterology

## 2018-12-12 DIAGNOSIS — R54 Age-related physical debility: Secondary | ICD-10-CM | POA: Insufficient documentation

## 2018-12-12 DIAGNOSIS — K7682 Hepatic encephalopathy: Secondary | ICD-10-CM | POA: Insufficient documentation

## 2018-12-12 DIAGNOSIS — K7290 Hepatic failure, unspecified without coma: Secondary | ICD-10-CM | POA: Insufficient documentation

## 2018-12-12 DIAGNOSIS — R140 Abdominal distension (gaseous): Secondary | ICD-10-CM | POA: Insufficient documentation

## 2018-12-12 DIAGNOSIS — E639 Nutritional deficiency, unspecified: Secondary | ICD-10-CM | POA: Insufficient documentation

## 2018-12-12 DIAGNOSIS — K7031 Alcoholic cirrhosis of liver with ascites: Secondary | ICD-10-CM | POA: Insufficient documentation
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
## 2018-12-13 ENCOUNTER — Other Ambulatory Visit: Payer: Self-pay

## 2018-12-13 DIAGNOSIS — K7031 Alcoholic cirrhosis of liver with ascites: Secondary | ICD-10-CM

## 2018-12-30 ENCOUNTER — Ambulatory Visit (HOSPITAL_COMMUNITY): Payer: Medicaid Other | Attending: Gastroenterology

## 2019-01-27 DEATH — deceased

## 2019-05-16 IMAGING — CR DG FEMUR 2+V PORT*L*
5 series · 5 of 5 positions shown · non-contrast
Comparison: December 13, 2016

CLINICAL DATA: Status post placement of intramedullary nail.

EXAM:
LEFT FEMUR PORTABLE 2 VIEWS

[AP (1 of 3)]
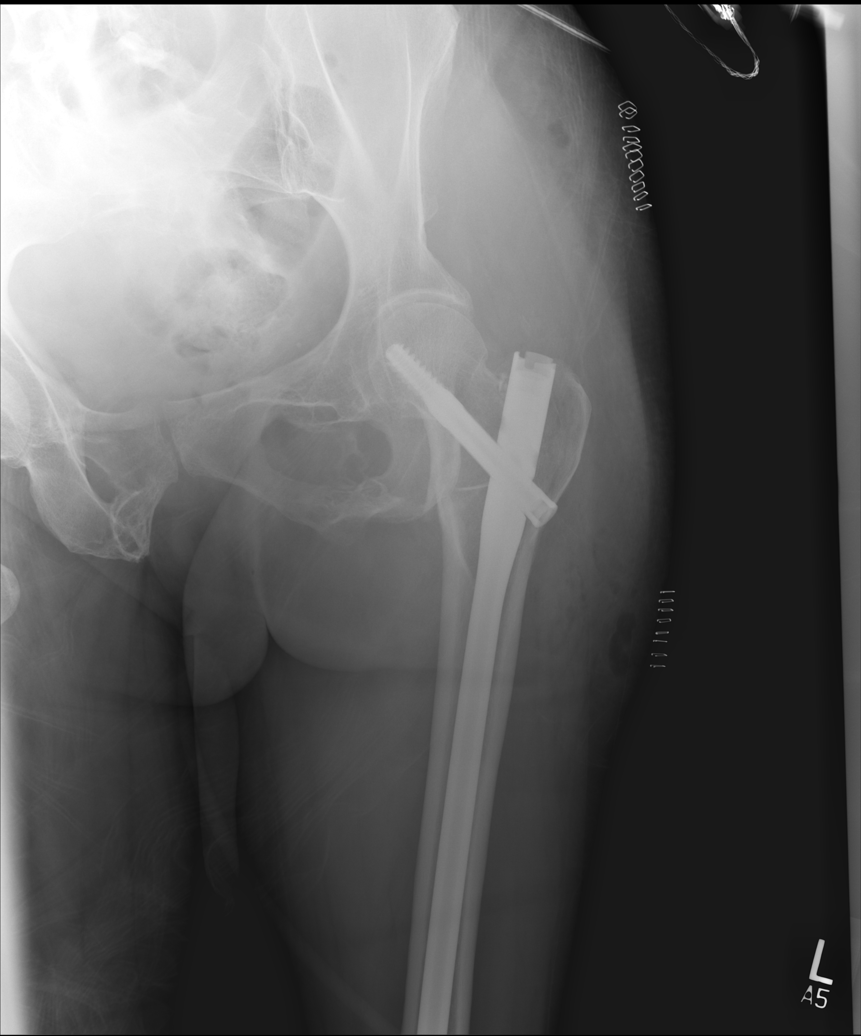

[AP (2 of 3)]
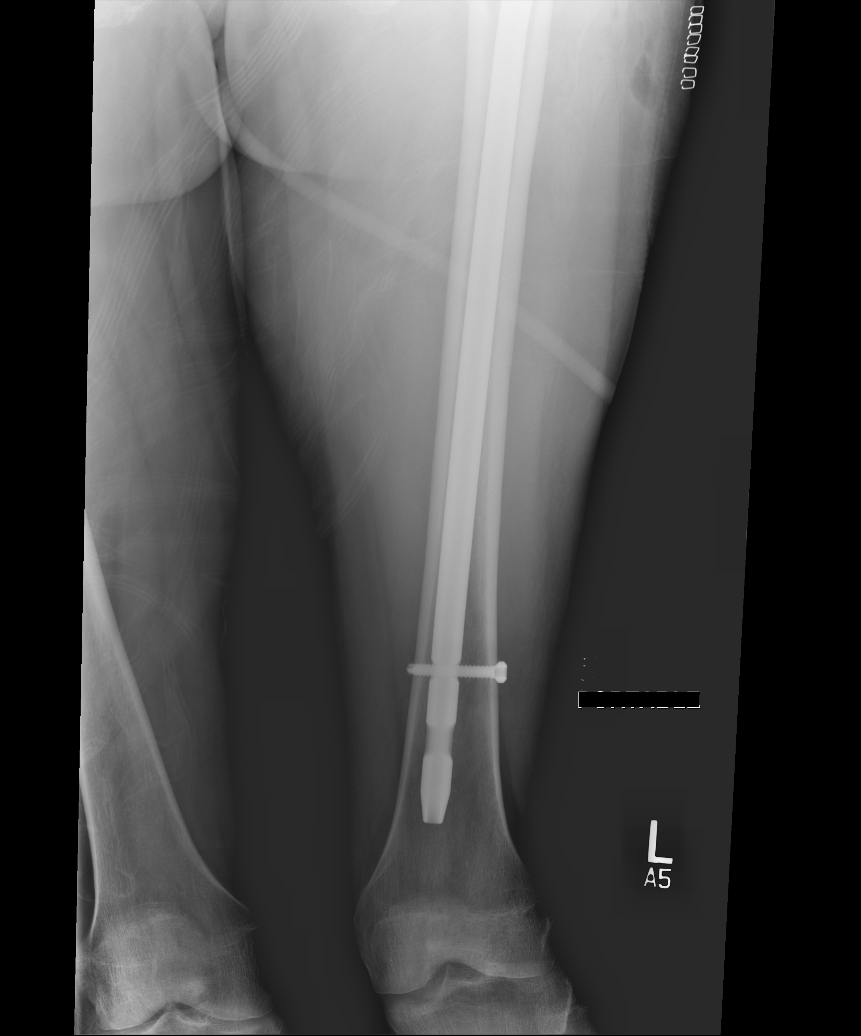

[xtable lateral (1 of 2)]
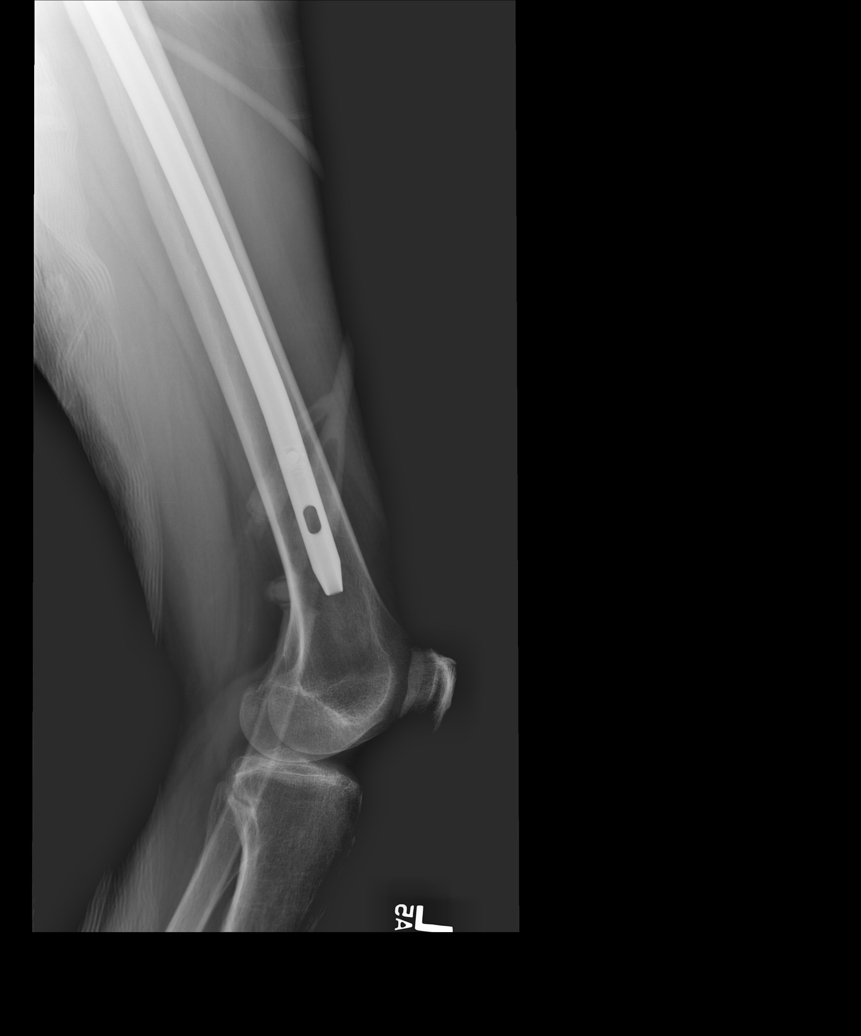

[xtable lateral (2 of 2)]
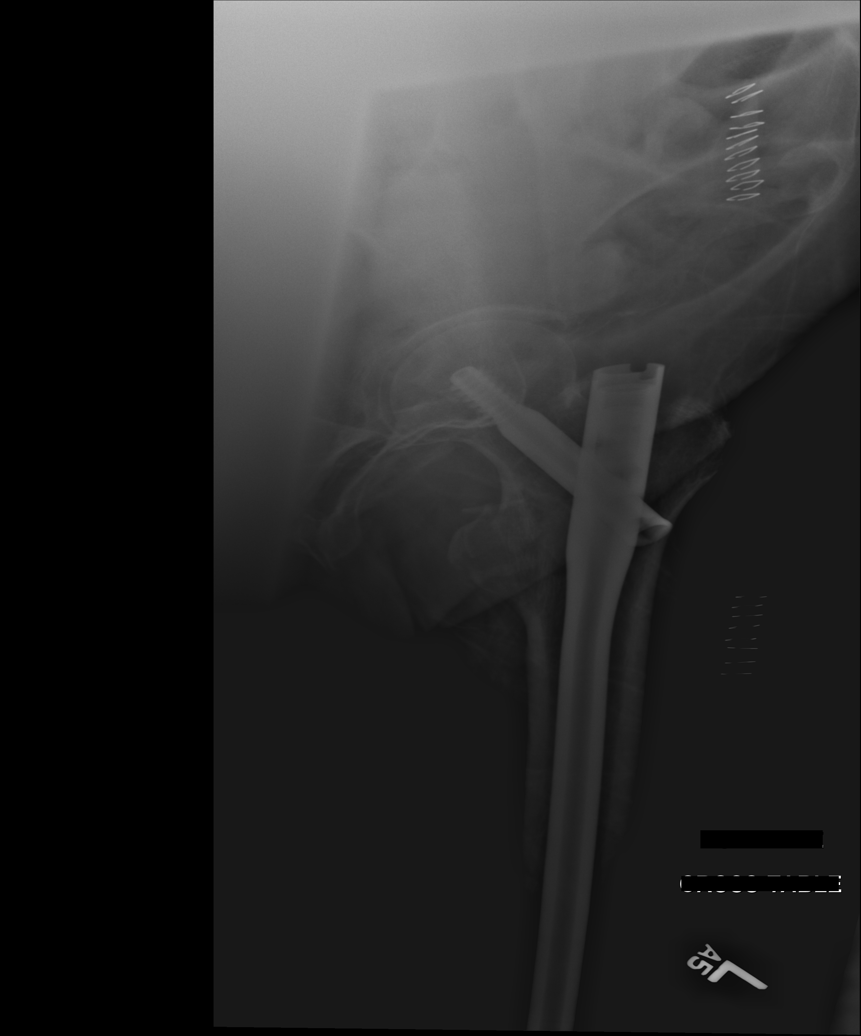

[AP (3 of 3)]
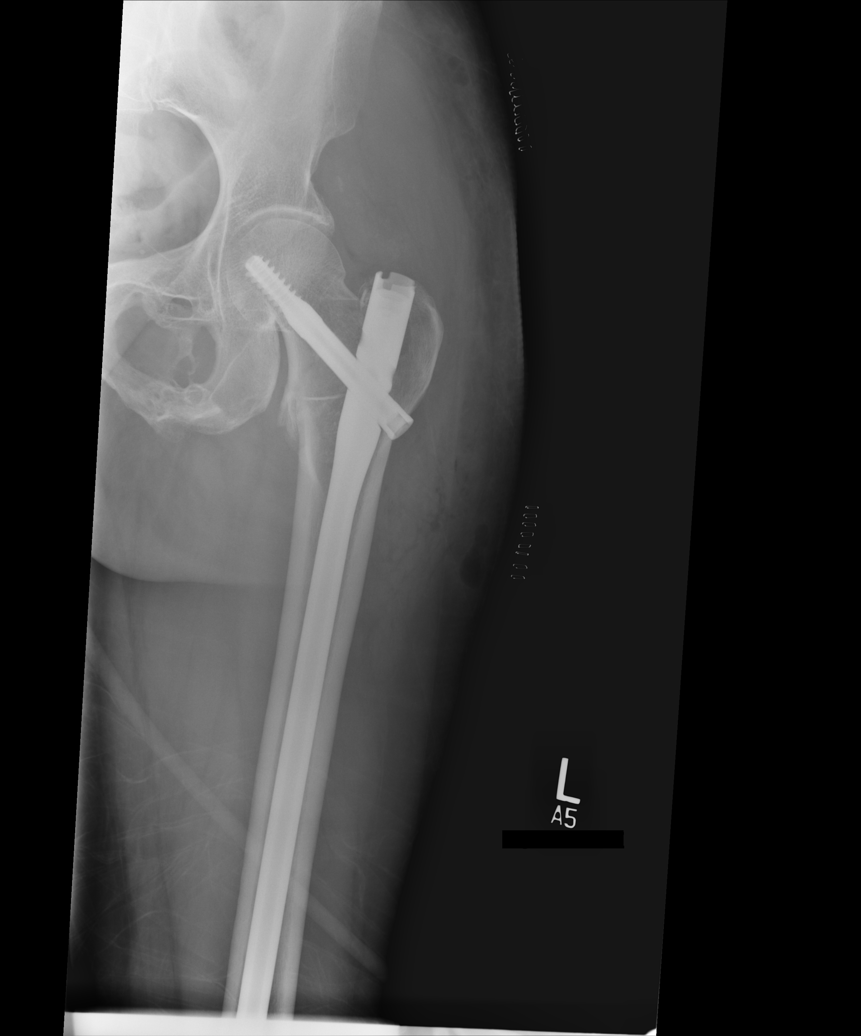

[5 of 5 positions shown; findings below may reference images not displayed]

FINDINGS: A gamma nail and intramedullary rod have been placed across the left
hip fracture. A distal interlocking screw is identified. Hardware is
in good position. Soft tissue gas is consistent with postsurgical
change. No other acute abnormalities. No dislocation.
IMPRESSION: Surgical hardware placement as above. No acute abnormalities
otherwise seen.

## 2019-05-16 IMAGING — RF DG FEMUR 2+V*L*
1 series · 4 of 4 positions shown · IV contrast (agent unspecified)
Comparison: 12/11/2016

CLINICAL DATA: Left hip intertrochanteric fracture

EXAM:
DG C-ARM 61-120 MIN; LEFT FEMUR 2 VIEWS
CONTRAST:  None.
FLUOROSCOPY TIME:  Fluoroscopy Time:  1 minutes 11 seconds

[Series 1: run · 4 of 4 slices shown]
[im 1/4]
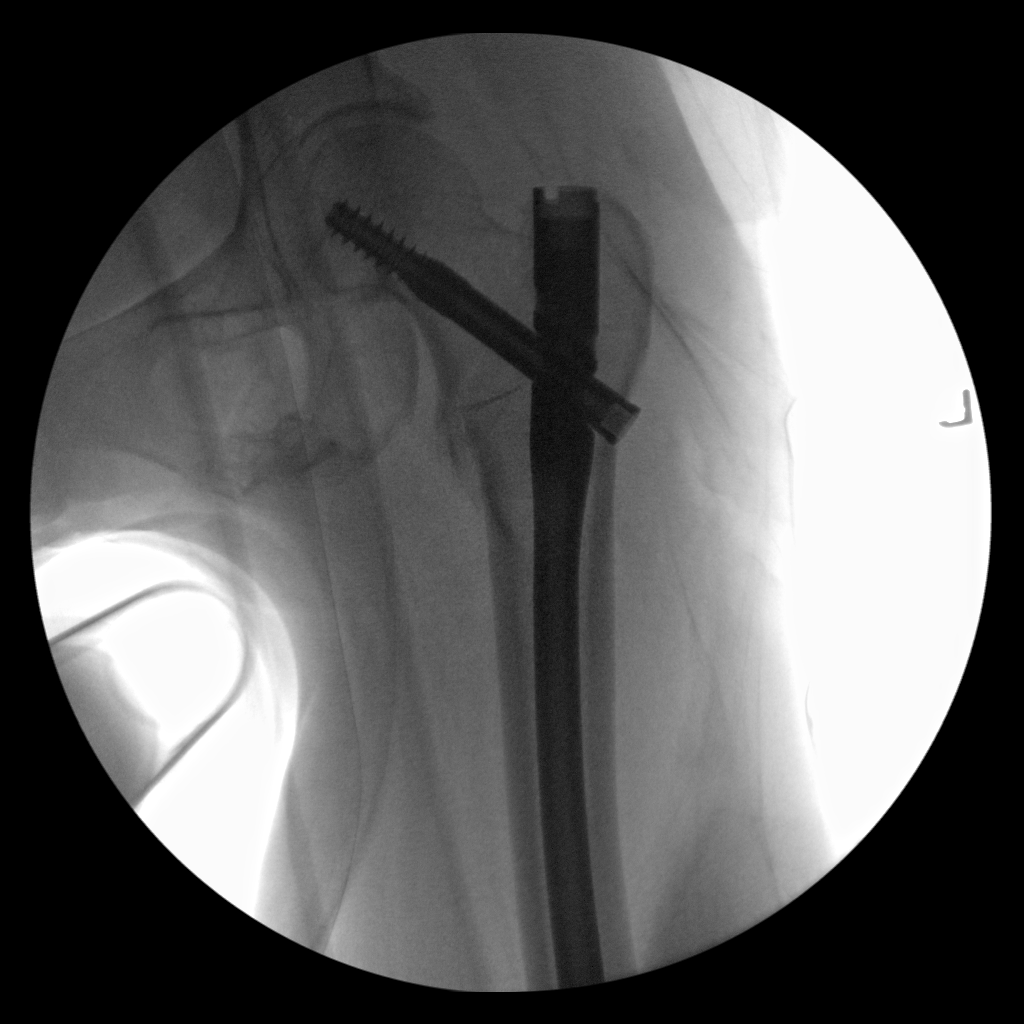
[im 2/4]
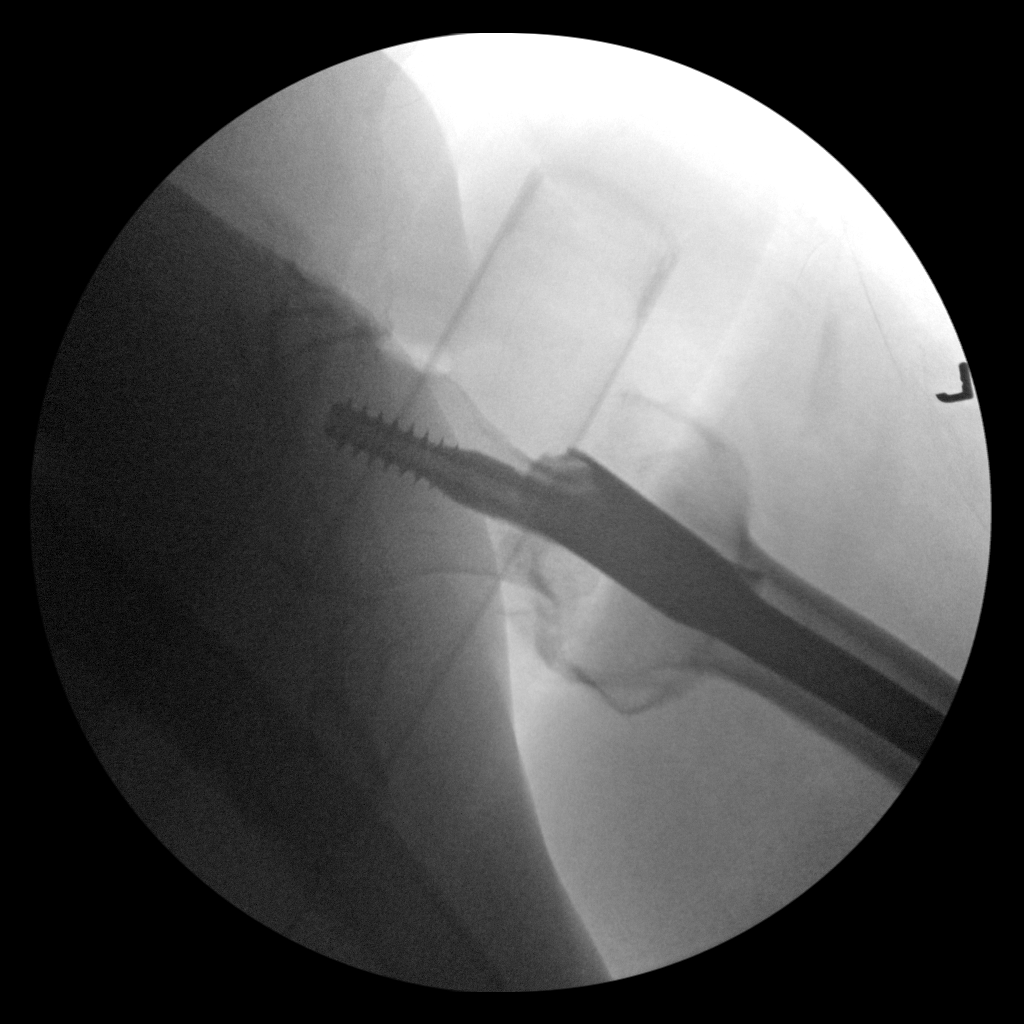
[im 3/4]
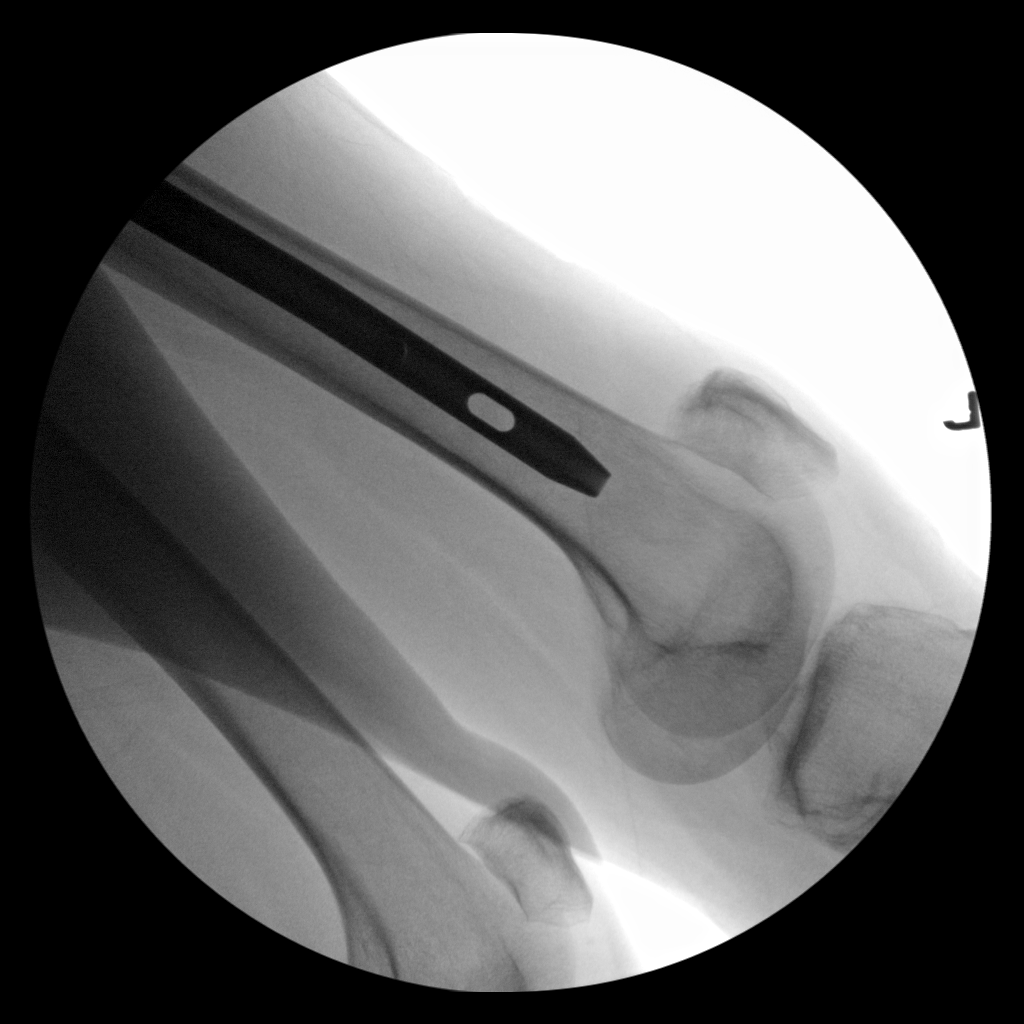
[im 4/4]
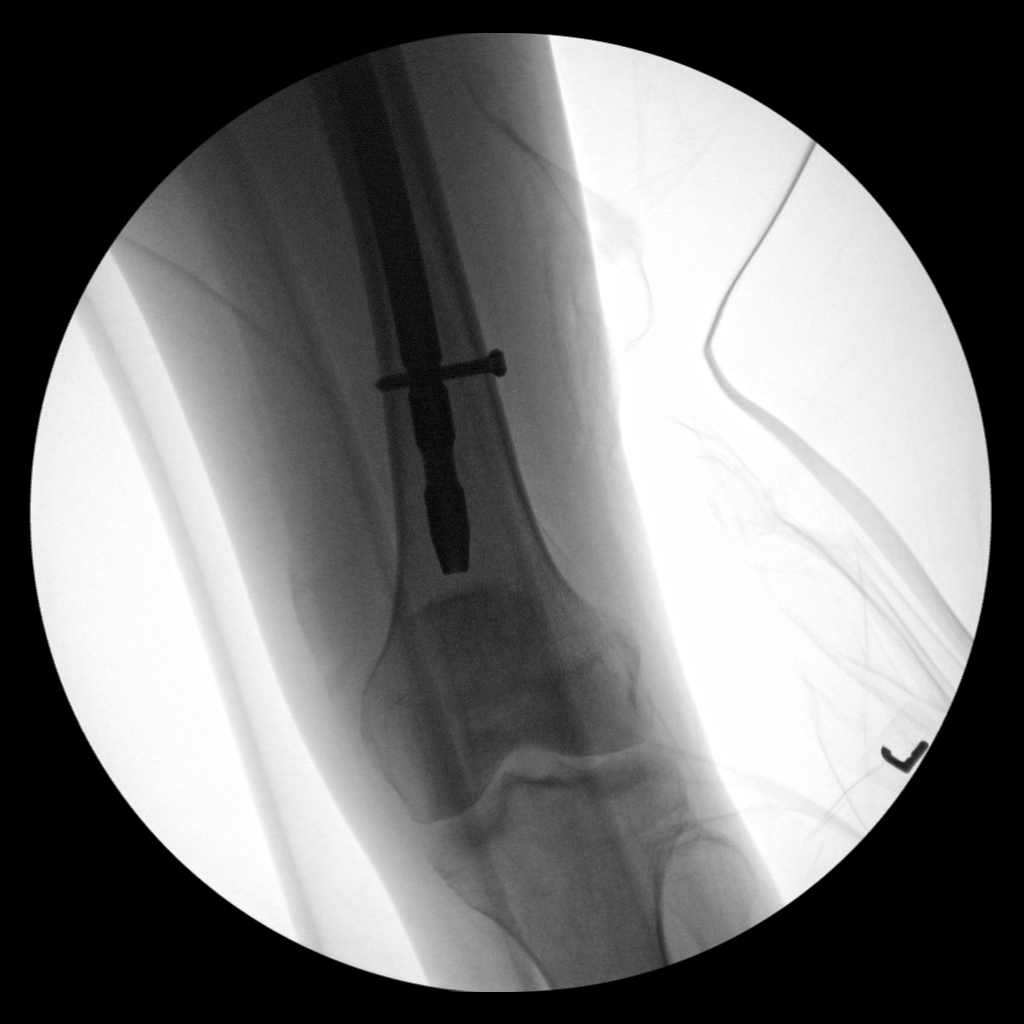

[4 of 4 positions shown; findings below may reference images not displayed]

FINDINGS: Four spot fluoroscopic intraoperative views demonstrate left femur
intramedullary rod and screw fixation to reduce the left femur
intertrochanteric fracture. Anatomic alignment. No complicating
feature.
IMPRESSION: Improved alignment following ORIF of the left hip intertrochanteric
fracture
